# Patient Record
Sex: Male | Born: 1970 | Race: Black or African American | Hispanic: No | Marital: Single | Smoking: Former smoker
Health system: Southern US, Community
[De-identification: ages and names within clinical notes are randomized; demographics above are authoritative.]

## PROBLEM LIST (undated history)

## (undated) DIAGNOSIS — G4730 Sleep apnea, unspecified: Secondary | ICD-10-CM

## (undated) DIAGNOSIS — G8929 Other chronic pain: Secondary | ICD-10-CM

## (undated) DIAGNOSIS — G43909 Migraine, unspecified, not intractable, without status migrainosus: Secondary | ICD-10-CM

## (undated) DIAGNOSIS — I498 Other specified cardiac arrhythmias: Secondary | ICD-10-CM

## (undated) DIAGNOSIS — I10 Essential (primary) hypertension: Secondary | ICD-10-CM

## (undated) DIAGNOSIS — M542 Cervicalgia: Secondary | ICD-10-CM

## (undated) DIAGNOSIS — J45909 Unspecified asthma, uncomplicated: Secondary | ICD-10-CM

## (undated) HISTORY — PX: TOOTH EXTRACTION: SUR596

---

## 2011-06-07 ENCOUNTER — Emergency Department: Payer: Self-pay | Admitting: Emergency Medicine

## 2014-05-29 ENCOUNTER — Emergency Department (HOSPITAL_COMMUNITY)
Admission: EM | Admit: 2014-05-29 | Discharge: 2014-05-29 | Disposition: A | Discharge status: Home / Self Care | Payer: 59 | Attending: Emergency Medicine | Admitting: Emergency Medicine

## 2014-05-29 ENCOUNTER — Encounter (HOSPITAL_COMMUNITY): Payer: Self-pay | Admitting: Emergency Medicine

## 2014-05-29 DIAGNOSIS — R42 Dizziness and giddiness: Secondary | ICD-10-CM | POA: Diagnosis not present

## 2014-05-29 DIAGNOSIS — R110 Nausea: Secondary | ICD-10-CM | POA: Diagnosis not present

## 2014-05-29 DIAGNOSIS — R6883 Chills (without fever): Secondary | ICD-10-CM | POA: Diagnosis not present

## 2014-05-29 DIAGNOSIS — H53149 Visual discomfort, unspecified: Secondary | ICD-10-CM | POA: Insufficient documentation

## 2014-05-29 DIAGNOSIS — G43909 Migraine, unspecified, not intractable, without status migrainosus: Secondary | ICD-10-CM | POA: Insufficient documentation

## 2014-05-29 HISTORY — DX: Cervicalgia: M54.2

## 2014-05-29 HISTORY — DX: Migraine, unspecified, not intractable, without status migrainosus: G43.909

## 2014-05-29 HISTORY — DX: Other specified cardiac arrhythmias: I49.8

## 2014-05-29 HISTORY — DX: Essential (primary) hypertension: I10

## 2014-05-29 HISTORY — DX: Other chronic pain: G89.29

## 2014-05-29 MED ORDER — DIPHENHYDRAMINE HCL 50 MG/ML IJ SOLN
25.0000 mg | INTRAMUSCULAR | Status: AC
  Administered 2014-05-29: 25 mg via INTRAVENOUS
  Filled 2014-05-29: qty 1

## 2014-05-29 MED ORDER — SODIUM CHLORIDE 0.9 % IV BOLUS (SEPSIS)
1000.0000 mL | Freq: Once | INTRAVENOUS | Status: AC
  Administered 2014-05-29: 1000 mL via INTRAVENOUS

## 2014-05-29 MED ORDER — KETOROLAC TROMETHAMINE 30 MG/ML IJ SOLN
30.0000 mg | INTRAMUSCULAR | Status: AC
  Administered 2014-05-29: 30 mg via INTRAVENOUS
  Filled 2014-05-29: qty 1

## 2014-05-29 MED ORDER — MORPHINE SULFATE 4 MG/ML IJ SOLN
4.0000 mg | Freq: Once | INTRAMUSCULAR | Status: AC
Start: 2014-05-29 — End: 2014-05-29
  Administered 2014-05-29: 4 mg via INTRAVENOUS
  Filled 2014-05-29: qty 1

## 2014-05-29 MED ORDER — PROCHLORPERAZINE EDISYLATE 5 MG/ML IJ SOLN
10.0000 mg | Freq: Once | INTRAMUSCULAR | Status: AC
  Administered 2014-05-29: 10 mg via INTRAVENOUS
  Filled 2014-05-29: qty 2

## 2014-05-29 NOTE — ED Notes (Signed)
Bed: [id number] Expected date: 05/29/14 Expected time: 3:32 PM Means of arrival: Ambulance Comments: Migraine

## 2014-05-29 NOTE — ED Notes (Signed)
Pt reports migraine L-sided, onset at 3-4am. Took imitrex 9am. Was going to drive to [place] this am but had "some orthostatic changes" per ems. Called ems then. EMS BP 140/80. Sinus arrythmia on ems ekg, this is normal for pt. IV 18g L FA. NS 100ml infused, 4mg  Zofran. Hx chronic neck pain.

## 2014-05-29 NOTE — Discharge Instructions (Signed)
Please follow the directions provided.  Be sure to follow up with your primary care provider regarding the frequency of your migraines and talk to him about preventive therapies.  Don't hesitate to return for new, concerning or worsening symptoms.     SEEK MEDICAL CARE IF:  You do not get relief from the medicines given to you.  You have a recurrence of pain.  You have a fever. SEEK IMMEDIATE MEDICAL CARE IF:  Your migraine becomes severe.  You have a stiff neck.  You have loss of vision.  You have muscular weakness or loss of muscle control.  You start losing your balance or have trouble walking.  You feel faint or pass out.  You have severe symptoms that are different from your first symptoms

## 2014-05-29 NOTE — ED Provider Notes (Signed)
CSN: [id number]     Arrival date & time 05/29/14  1540 History   First MD Initiated Contact with Patient 05/29/14 1543     Chief Complaint  Patient presents with  . Migraine  . Nausea  . Abdominal Pain   (Consider location/radiation/quality/duration/timing/severity/associated sxs/prior Treatment) HPI Taylor Gordon is a 43 yo male presenting with migraine onset 3 am.  He has a history of migraines and his last migraine was 2 days ago.  This headache is similar to his other headaches.  He describes the pain as sharp and constant and rates it as 10/10.  His associated symptoms include nausea, some dizziness, chills photophobia and left eye pain.  He denies any unilateral tearing or loss of vision, fever, neck stiffness or vomiting.   No past medical history on file. No past surgical history on file. No family history on file. History  Substance Use Topics  . Smoking status: Not on file  . Smokeless tobacco: Not on file  . Alcohol Use: Not on file    Review of Systems  Constitutional: Positive for chills. Negative for fever.  HENT: Negative for sore throat.   Eyes: Positive for photophobia. Negative for visual disturbance.  Respiratory: Negative for cough and shortness of breath.   Cardiovascular: Negative for chest pain and leg swelling.  Gastrointestinal: Positive for nausea. Negative for vomiting and diarrhea.  Genitourinary: Negative for dysuria.  Musculoskeletal: Negative for myalgias.  Skin: Negative for rash.  Neurological: Positive for dizziness and headaches. Negative for weakness and numbness.    Allergies  Review of patient's allergies indicates not on file.  Home Medications   Prior to Admission medications   Not on File   BP 157/103  Pulse 95  Temp(Src) 98.6 F (37 C) (Oral)  Resp 20  SpO2 100% Physical Exam  Nursing note and vitals reviewed. Constitutional: He is oriented to person, place, and time. He appears well-developed and well-nourished. No  distress.  HENT:  Head: Normocephalic and atraumatic.    Mouth/Throat: Oropharynx is clear and moist. No oropharyngeal exudate.  Eyes: Conjunctivae are normal. Pupils are equal, round, and reactive to light.  Neck: Normal range of motion. Neck supple. No thyromegaly present.  Cardiovascular: Normal rate, regular rhythm and intact distal pulses.   Pulmonary/Chest: Effort normal and breath sounds normal. No respiratory distress. He has no wheezes. He has no rales. He exhibits no tenderness.  Abdominal: Soft. There is no tenderness.  Musculoskeletal: He exhibits no tenderness.  Lymphadenopathy:    He has no cervical adenopathy.  Neurological: He is alert and oriented to person, place, and time. No cranial nerve deficit or sensory deficit. Coordination normal. GCS eye subscore is 4. GCS verbal subscore is 5. GCS motor subscore is 6.  Skin: Skin is warm and dry. No rash noted. He is not diaphoretic.  Psychiatric: He has a normal mood and affect.    ED Course  Procedures (including critical care time) Labs Review Labs Reviewed - No data to display  Imaging Review No results found.   EKG Interpretation None      MDM   Final diagnoses:  Migraine without status migrainosus, not intractable, unspecified migraine type   43 yo with migraine similar in presentation to previous migraines, not relieved by imitrex.  Presentation is like pts typical HA and non concerning for [facility], ICH, Meningitis, or temporal arteritis.  Neuro exam is normal and no focal neuro deficits, pt is afebrile, no nuchal rigidity or visual changes.  No indication  for imaging or labs.  NS bolus with toradol, compazine and benadryl and re-eval. Pt HA treated and improved while in ED.  Discharge instructions include follow-up with PCP to discuss prophylactic medication. Pt verbalizes understanding and is agreeable with plan to dc. Return precautions provided.  Filed Vitals:   05/29/14 1541 05/29/14 1702 05/29/14 1822  BP:  157/103 143/104 135/91  Pulse: 95 92 70  Temp: 98.6 F (37 C)    TempSrc: Oral    Resp: 20 18 14   SpO2: 100% 99% 100%   Meds given in ED:  Medications  sodium chloride 0.9 % bolus 1,000 mL (0 mLs Intravenous Stopped 05/29/14 1718)  ketorolac (TORADOL) 30 MG/ML injection 30 mg (30 mg Intravenous Given 05/29/14 1620)  prochlorperazine (COMPAZINE) injection 10 mg (10 mg Intravenous Given 05/29/14 1620)  diphenhydrAMINE (BENADRYL) injection 25 mg (25 mg Intravenous Given 05/29/14 1620)  morphine 4 MG/ML injection 4 mg (4 mg Intravenous Given 05/29/14 1717)    Discharge Medication List as of 05/29/2014  6:00 PM         Harle BattiestElizabeth Shaman Muscarella, NP 05/30/14 2351

## 2014-05-31 NOTE — ED Provider Notes (Signed)
Medical screening examination/treatment/procedure(s) were performed by non-physician practitioner and as supervising physician I was immediately available for consultation/collaboration.    Vida RollerBrian D Calee Nugent, MD 05/31/14 [phone]

## 2020-03-30 ENCOUNTER — Emergency Department
Admission: EM | Admit: 2020-03-30 | Discharge: 2020-03-30 | Disposition: A | Discharge status: Home / Self Care | Payer: 59 | Attending: Student in an Organized Health Care Education/Training Program | Admitting: Student in an Organized Health Care Education/Training Program

## 2020-03-30 ENCOUNTER — Encounter: Payer: Self-pay | Admitting: Emergency Medicine

## 2020-03-30 ENCOUNTER — Emergency Department: Payer: 59

## 2020-03-30 ENCOUNTER — Other Ambulatory Visit: Payer: Self-pay

## 2020-03-30 DIAGNOSIS — R410 Disorientation, unspecified: Secondary | ICD-10-CM | POA: Insufficient documentation

## 2020-03-30 DIAGNOSIS — Z87891 Personal history of nicotine dependence: Secondary | ICD-10-CM | POA: Diagnosis not present

## 2020-03-30 DIAGNOSIS — I10 Essential (primary) hypertension: Secondary | ICD-10-CM | POA: Insufficient documentation

## 2020-03-30 DIAGNOSIS — Z79899 Other long term (current) drug therapy: Secondary | ICD-10-CM | POA: Insufficient documentation

## 2020-03-30 DIAGNOSIS — R5383 Other fatigue: Secondary | ICD-10-CM | POA: Insufficient documentation

## 2020-03-30 LAB — BASIC METABOLIC PANEL
Anion gap: 12 (ref 5–15)
BUN: 7 mg/dL (ref 6–20)
CO2: 26 mmol/L (ref 22–32)
Calcium: 9.3 mg/dL (ref 8.9–10.3)
Chloride: 102 mmol/L (ref 98–111)
Creatinine, Ser: 0.71 mg/dL (ref 0.61–1.24)
GFR calc Af Amer: >60 mL/min (ref >60)
GFR calc non Af Amer: >60 mL/min (ref >60)
Glucose, Bld: 82 mg/dL (ref 70–99)
Potassium: 4.3 mmol/L (ref 3.5–5.1)
Sodium: 140 mmol/L (ref 135–145)

## 2020-03-30 LAB — URINALYSIS, COMPLETE (UACMP) WITH MICROSCOPIC
Bacteria, UA: NONE SEEN
Bilirubin Urine: NEGATIVE
Glucose, UA: NEGATIVE mg/dL
Hgb urine dipstick: NEGATIVE
Ketones, ur: NEGATIVE mg/dL
Leukocytes,Ua: NEGATIVE
Nitrite: NEGATIVE
Protein, ur: NEGATIVE mg/dL
Specific Gravity, Urine: 1.013 (ref 1.005–1.030)
Squamous Epithelial / LPF: NONE SEEN (ref 0–5)
pH: 7 (ref 5.0–8.0)

## 2020-03-30 LAB — CBC
HCT: 45.2 % (ref 39.0–52.0)
Hemoglobin: 15.9 g/dL (ref 13.0–17.0)
MCH: 30.9 pg (ref 26.0–34.0)
MCHC: 35.2 g/dL (ref 30.0–36.0)
MCV: 87.8 fL (ref 80.0–100.0)
Platelets: 280 10*3/uL (ref 150–400)
RBC: 5.15 MIL/uL (ref 4.22–5.81)
RDW: 12.7 % (ref 11.5–15.5)
WBC: 4.9 10*3/uL (ref 4.0–10.5)
nRBC: 0 % (ref 0.0–0.2)

## 2020-03-30 LAB — GLUCOSE, CAPILLARY: Glucose-Capillary: 110 mg/dL — ABNORMAL HIGH (ref 70–99)

## 2020-03-30 NOTE — ED Triage Notes (Signed)
Patient reports he received his second COVID vaccine in April. Since then, he has had episodes of confusion or "brain fog". States he is unable to remember how to do basic tasks that he does daily. Patient reports this morning he was unable to set his work station up. Reports last episode was Monday or Tuesday. States episodes last from a few seconds to several minutes. Denies weakness. States he has had trouble staying caught up at work due to trouble initiating tasks and problem solving.

## 2020-03-30 NOTE — ED Triage Notes (Signed)
First Nurse Note:  Arrives stating that since April he has felt intermittently spacey and confused.  Patient states he has felt this way since receiving COVID shot.    Patient is AAOx3. Skin warm and dry.  MAE equally and strong.  NAD

## 2020-03-30 NOTE — ED Notes (Signed)
This RN attempted IV stick x 2 with no success.

## 2020-03-30 NOTE — ED Provider Notes (Signed)
[facility] Emergency Department Provider Note    First MD Initiated Contact with Patient 03/30/20 1300     (approximate)  I have reviewed the triage vital signs and the nursing notes.   HISTORY  Chief Complaint Altered Mental Status    HPI Taylor Gordon is a 49 y.o. male with the below listed past medical history presents to the ER for evaluation of intermittent confusion some fatigue and feeling like he is functioning less efficiently at work since April.  Feels like the symptoms started around the time that he got his Covid vaccination.  Denies any fevers.  Does have chronic migraines.  No new medications.  Does not drink alcohol.  Has not suddenly stopped any medications.  Does not feel like he is under any increased stress at home or at work.    Past Medical History:  Diagnosis Date  . Chronic neck pain   . Hypertension   . Migraine   . Sinus arrhythmia seen on electrocardiogram    No family history on file. History reviewed. No pertinent surgical history. There are no problems to display for this patient.     Prior to Admission medications   Medication Sig Start Date End Date Taking? Authorizing Provider  acetaminophen (TYLENOL) 500 MG tablet Take 1,000 mg by mouth every 6 (six) hours as needed for headache.    [provider]  propranolol (INDERAL) 10 MG tablet Take 10 mg by mouth 2 (two) times daily.    [provider]  SUMAtriptan (IMITREX) 25 MG tablet Take 25 mg by mouth every 2 (two) hours as needed for migraine. May repeat in 2 hours if headache persists or recurs.    [provider]    Allergies Prednisone    Social History Social History   Tobacco Use  . Smoking status: Former Smoker  Substance Use Topics  . Alcohol use: Yes    Comment: socially  . Drug use: No    Review of Systems Patient denies headaches, rhinorrhea, blurry vision, numbness, shortness of breath, chest pain, edema,  cough, abdominal pain, nausea, vomiting, diarrhea, dysuria, fevers, rashes or hallucinations unless otherwise stated above in HPI. ____________________________________________   PHYSICAL EXAM:  VITAL SIGNS: Vitals:   03/30/20 0939 03/30/20 1426  BP: (!) 178/127 (!) 148/107  Pulse: (!) 122 88  Resp: 16 16  Temp: 99 F (37.2 C)   SpO2: 98% 98%    Constitutional: Alert and oriented.  Eyes: Conjunctivae are normal.  Head: Atraumatic. Nose: No congestion/rhinnorhea. Mouth/Throat: Mucous membranes are moist.   Neck: No stridor. Painless ROM.  Cardiovascular: Normal rate, regular rhythm. Grossly normal heart sounds.  Good peripheral circulation. Respiratory: Normal respiratory effort.  No retractions. Lungs CTAB. Gastrointestinal: Soft and nontender. No distention. No abdominal bruits. No CVA tenderness. Genitourinary:  Musculoskeletal: No lower extremity tenderness nor edema.  No joint effusions. Neurologic: CN- intact.  No facial droop, Normal FNF.  Normal heel to shin.  Sensation intact bilaterally. Normal speech and language. No gross focal neurologic deficits are appreciated. No gait instability. Skin:  Skin is warm, dry and intact. No rash noted. Psychiatric: Mood and affect are normal. Speech and behavior are normal.  ____________________________________________   LABS (all labs ordered are listed, but only abnormal results are displayed)  Results for orders placed or performed during the hospital encounter of 03/30/20 (from the past 24 hour(s))  Glucose, capillary     Status: Abnormal   Collection Time: 03/30/20  9:54 AM  Result Value Ref Range   Glucose-Capillary 110 (H) 70 - 99 mg/dL  CBC     Status: None   Collection Time: 03/30/20  9:55 AM  Result Value Ref Range   WBC 4.9 4.0 - 10.5 K/uL   RBC 5.15 4.22 - 5.81 MIL/uL   Hemoglobin 15.9 13.0 - 17.0 g/dL   HCT 08.6 39 - 52 %   MCV 87.8 80.0 - 100.0 fL   MCH 30.9 26.0 - 34.0 pg   MCHC 35.2 30.0 - 36.0 g/dL   RDW  76.1 95.0 - 93.2 %   Platelets 280 150 - 400 K/uL   nRBC 0.0 0.0 - 0.2 %  Urinalysis, Complete w Microscopic     Status: Abnormal   Collection Time: 03/30/20  1:58 PM  Result Value Ref Range   Color, Urine YELLOW (A) YELLOW   APPearance CLEAR (A) CLEAR   Specific Gravity, Urine 1.013 1.005 - 1.030   pH 7.0 5.0 - 8.0   Glucose, UA NEGATIVE NEGATIVE mg/dL   Hgb urine dipstick NEGATIVE NEGATIVE   Bilirubin Urine NEGATIVE NEGATIVE   Ketones, ur NEGATIVE NEGATIVE mg/dL   Protein, ur NEGATIVE NEGATIVE mg/dL   Nitrite NEGATIVE NEGATIVE   Leukocytes,Ua NEGATIVE NEGATIVE   WBC, UA 0-5 0 - 5 WBC/hpf   Bacteria, UA NONE SEEN NONE SEEN   Squamous Epithelial / LPF NONE SEEN 0 - 5   Mucus PRESENT   Basic metabolic panel     Status: None   Collection Time: 03/30/20  1:58 PM  Result Value Ref Range   Sodium 140 135 - 145 mmol/L   Potassium 4.3 3.5 - 5.1 mmol/L   Chloride 102 98 - 111 mmol/L   CO2 26 22 - 32 mmol/L   Glucose, Bld 82 70 - 99 mg/dL   BUN 7 6 - 20 mg/dL   Creatinine, Ser 6.71 0.61 - 1.24 mg/dL   Calcium 9.3 8.9 - 24.5 mg/dL   GFR calc non Af Amer >60 >60 mL/min   GFR calc Af Amer >60 >60 mL/min   Anion gap 12 5 - 15   ____________________________________________  EKG My review and personal interpretation at Time: 9:40   Indication: confusion  Rate: 105  Rhythm: sinus Axis: normal Other: nonspecific t wave abn, no stemi ____________________________________________  RADIOLOGY  I personally reviewed all radiographic images ordered to evaluate for the above acute complaints and reviewed radiology reports and findings.  These findings were personally discussed with the patient.  Please see medical record for radiology report.  ____________________________________________   PROCEDURES  Procedure(s) performed:  Procedures    Critical Care performed: no ____________________________________________   INITIAL IMPRESSION / ASSESSMENT AND PLAN / ED COURSE  Pertinent  labs & imaging results that were available during my care of the patient were reviewed by me and considered in my medical decision making (see chart for details).   DDX: Dehydration, electrolyte abnormality, anemia, medication reaction, CVA, mass  Taylor Gordon is a 49 y.o. who presents to the ED with symptoms as described above.  Patient well-appearing clinically no focal neuro deficits.  Symptoms have been ongoing since April.  Afebrile hemodynamically stable.  No signs of infectious process.  Blood work is reassuring.  No clear explanation for his symptoms but do feel that he benefit from referral to neurology.     The patient was evaluated in Emergency Department today for the symptoms described in the history of present illness. He/she was evaluated in the context of the  global COVID-19 pandemic, which necessitated consideration that the patient might be at risk for infection with the SARS-CoV-2 virus that causes COVID-19. Institutional protocols and algorithms that pertain to the evaluation of patients at risk for COVID-19 are in a state of rapid change based on information released by regulatory bodies including the CDC and federal and state organizations. These policies and algorithms were followed during the patient's care in the ED.  As part of my medical decision making, I reviewed the following data within the electronic MEDICAL RECORD NUMBER Nursing notes reviewed and incorporated, Labs reviewed, notes from prior ED visits and [place] Controlled Substance Database   ____________________________________________   FINAL CLINICAL IMPRESSION(S) / ED DIAGNOSES  Final diagnoses:  Confusion      NEW MEDICATIONS STARTED DURING THIS VISIT:  New Prescriptions   No medications on file     Note:  This document was prepared using Dragon voice recognition software and may include unintentional dictation errors.    Willy Eddy, MD 03/30/20 1459

## 2021-08-28 IMAGING — CT CT HEAD W/O CM
3 series · 16 of 47 positions shown, 19 images · non-contrast
Comparison: None.

CLINICAL DATA: Intermittent confusion and mental status change.
Patient reports symptoms since COVID vaccination.

EXAM:
CT HEAD WITHOUT CONTRAST
TECHNIQUE: Contiguous axial images were obtained from the base of the skull
through the vertex without intravenous contrast.

[Series 2: head wo · axial · 0.44mm/px · z∈[+518,+648]mm · 10 of 32 slices shown, 13 images]
[im 3/32  brain]
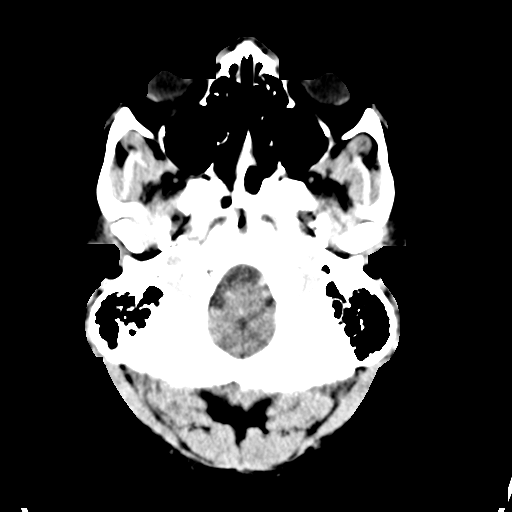
[im 3/32  bone]
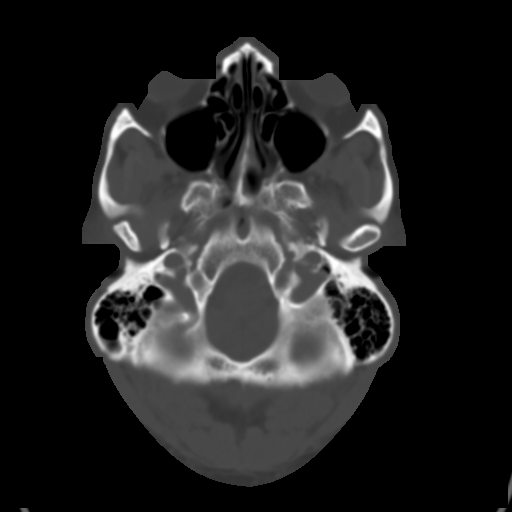
[im 6/32  brain]
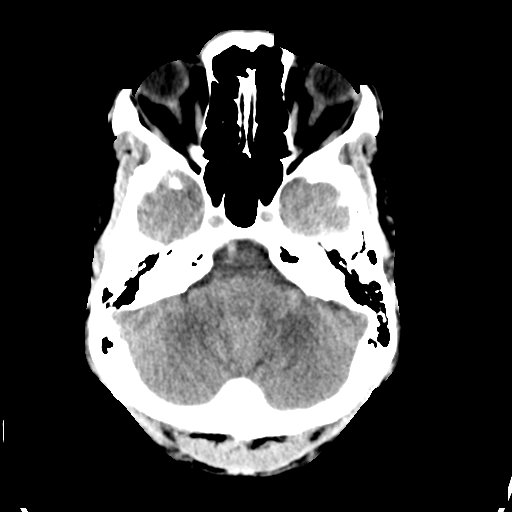
[im 9/32  brain]
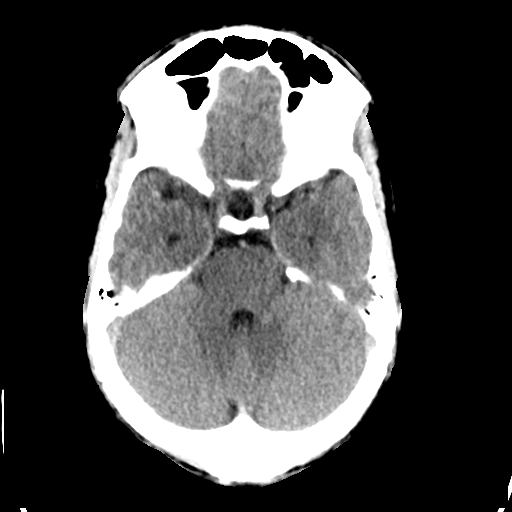
[im 11/32  brain]
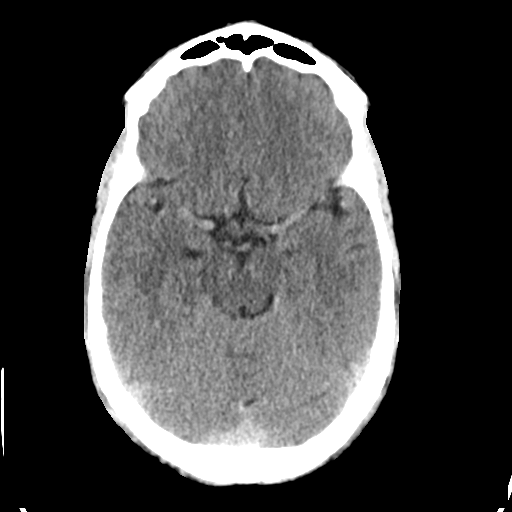
[im 14/32  brain]
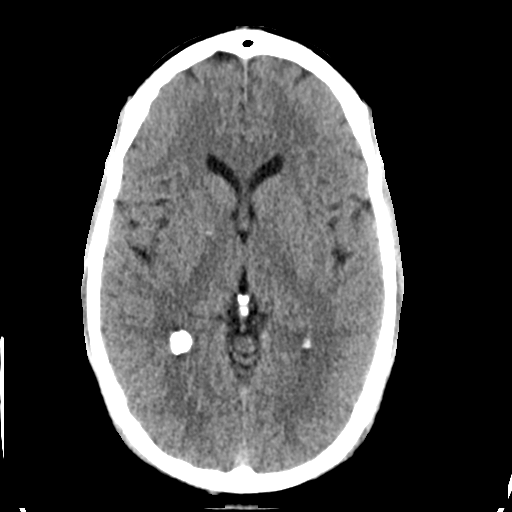
[im 14/32  bone]
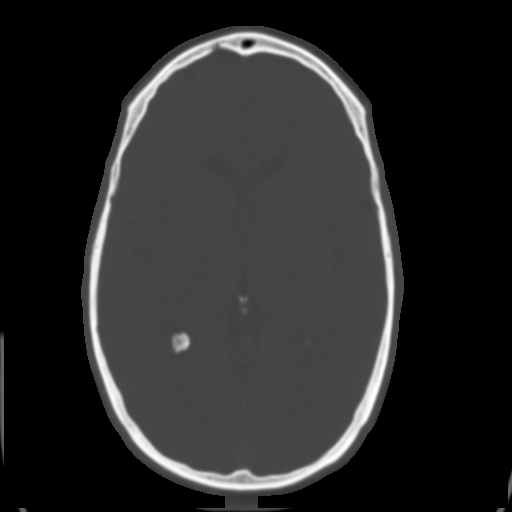
[im 18/32  brain]
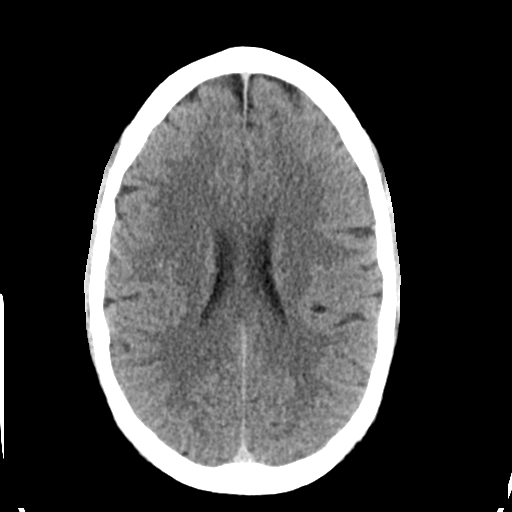
[im 21/32  brain]
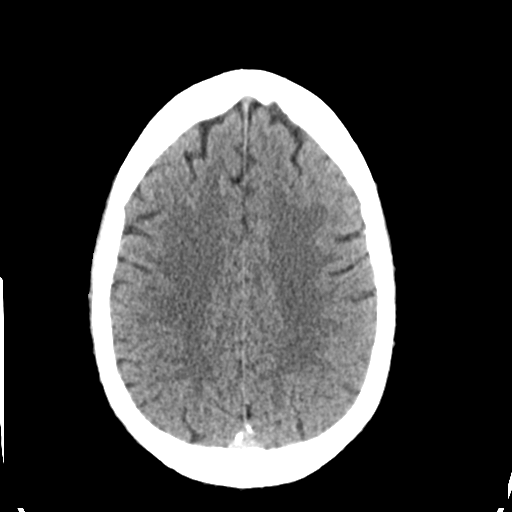
[im 24/32  brain]
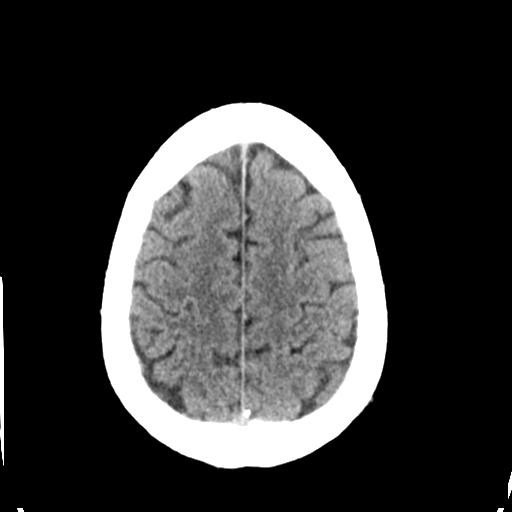
[im 26/32  brain]
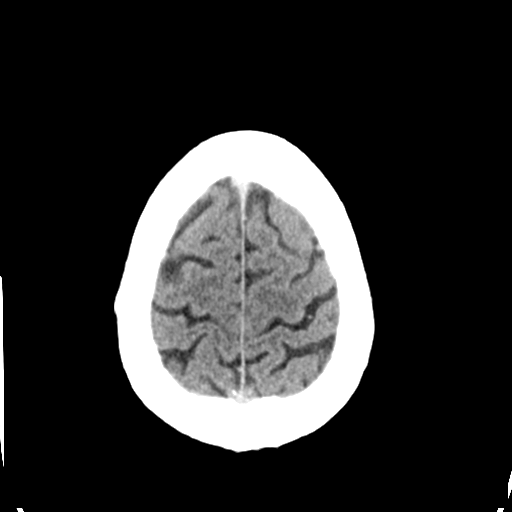
[im 26/32  bone]
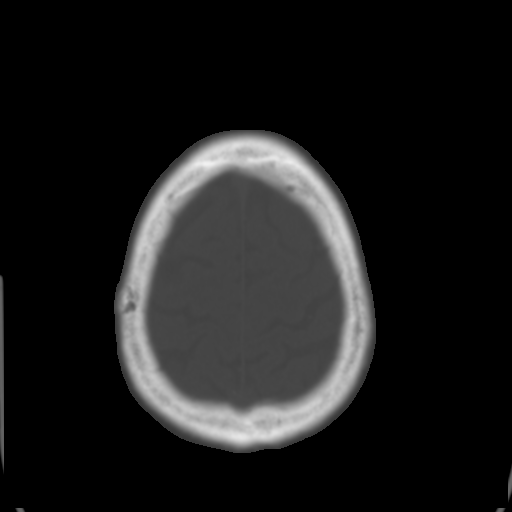
[im 29/32  brain]
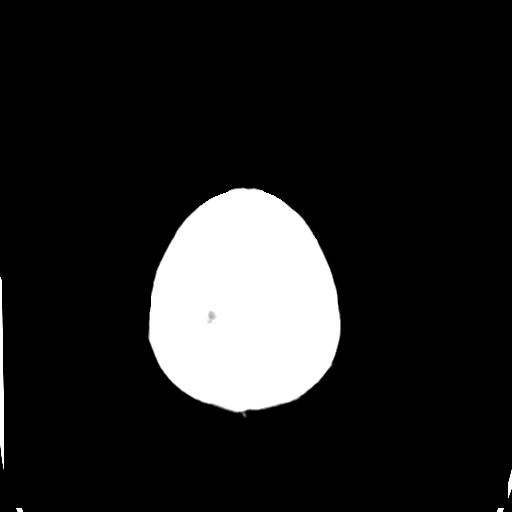

[Series 4: coronal soft tissue · coronal · 0.32mm/px · 3 of 75 slices shown]
[im 25/75  brain]
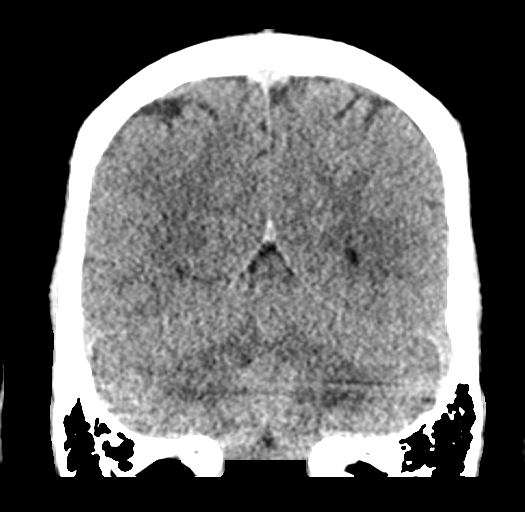
[im 33/75  brain]
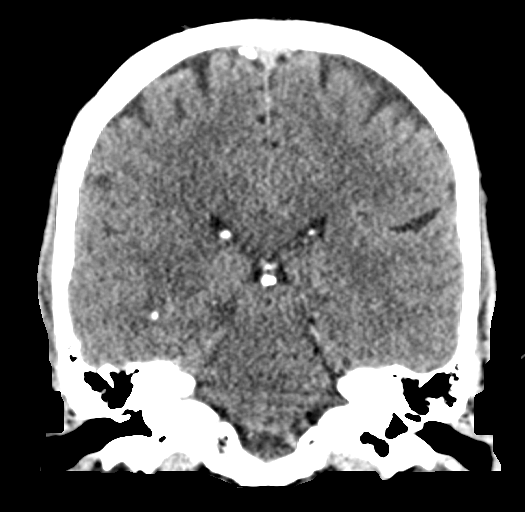
[im 42/75  brain]
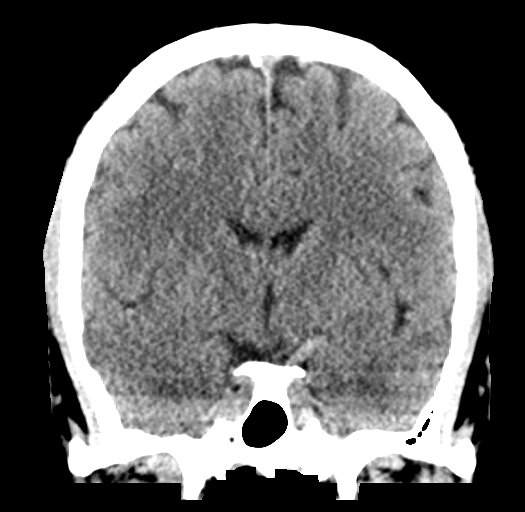

[Series 5: sagittal soft tissue · sagittal · 0.32mm/px · 3 of 54 slices shown]
[im 18/54  brain]
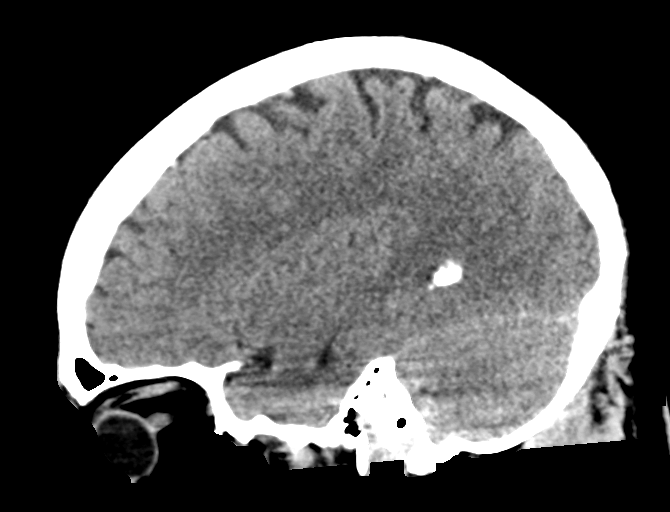
[im 27/54  brain]
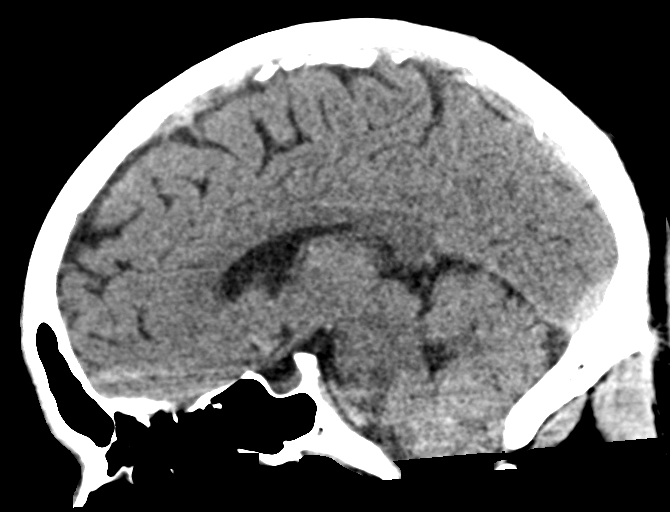
[im 36/54  brain]
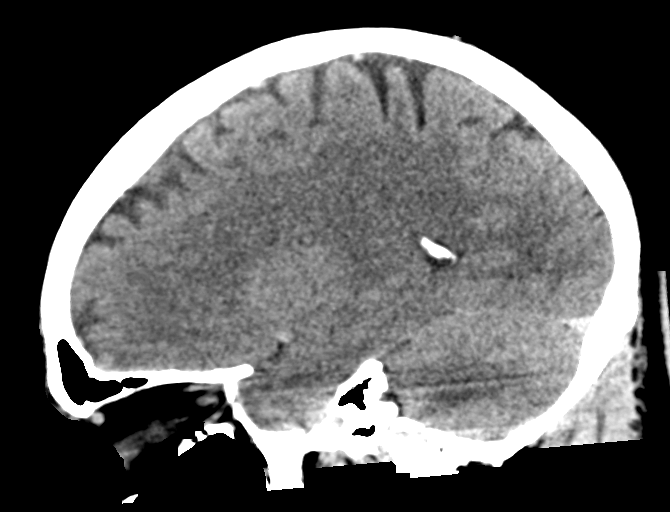

[16 of 47 positions shown; findings below may reference images not displayed]

FINDINGS: Brain: No evidence of acute infarction, hemorrhage, hydrocephalus,
extra-axial collection or mass lesion/mass effect.

Vascular: No hyperdense vessel or unexpected calcification.

Skull: Normal. Negative for fracture or focal lesion.

Sinuses/Orbits: No acute finding.

Other: None.
IMPRESSION: No acute intracranial abnormalities. Normal brain.

## 2022-05-01 ENCOUNTER — Emergency Department: Payer: Commercial Managed Care - PPO

## 2022-05-01 ENCOUNTER — Emergency Department
Admission: EM | Admit: 2022-05-01 | Discharge: 2022-05-01 | Disposition: A | Discharge status: Home / Self Care | Payer: Commercial Managed Care - PPO | Attending: Emergency Medicine | Admitting: Emergency Medicine

## 2022-05-01 ENCOUNTER — Encounter: Payer: Self-pay | Admitting: Emergency Medicine

## 2022-05-01 ENCOUNTER — Other Ambulatory Visit: Payer: Self-pay

## 2022-05-01 DIAGNOSIS — R109 Unspecified abdominal pain: Secondary | ICD-10-CM

## 2022-05-01 DIAGNOSIS — R1032 Left lower quadrant pain: Secondary | ICD-10-CM | POA: Diagnosis present

## 2022-05-01 DIAGNOSIS — R319 Hematuria, unspecified: Secondary | ICD-10-CM | POA: Insufficient documentation

## 2022-05-01 LAB — CBC WITH DIFFERENTIAL/PLATELET
Abs Immature Granulocytes: 0.02 10*3/uL (ref 0.00–0.07)
Basophils Absolute: 0.1 10*3/uL (ref 0.0–0.1)
Basophils Relative: 1 %
Eosinophils Absolute: 0.2 10*3/uL (ref 0.0–0.5)
Eosinophils Relative: 3 %
HCT: 47.6 % (ref 39.0–52.0)
Hemoglobin: 16.1 g/dL (ref 13.0–17.0)
Immature Granulocytes: 0 %
Lymphocytes Relative: 31 %
Lymphs Abs: 2 10*3/uL (ref 0.7–4.0)
MCH: 30.3 pg (ref 26.0–34.0)
MCHC: 33.8 g/dL (ref 30.0–36.0)
MCV: 89.6 fL (ref 80.0–100.0)
Monocytes Absolute: 0.6 10*3/uL (ref 0.1–1.0)
Monocytes Relative: 9 %
Neutro Abs: 3.7 10*3/uL (ref 1.7–7.7)
Neutrophils Relative %: 56 %
Platelets: 292 10*3/uL (ref 150–400)
RBC: 5.31 MIL/uL (ref 4.22–5.81)
RDW: 13.1 % (ref 11.5–15.5)
WBC: 6.6 10*3/uL (ref 4.0–10.5)
nRBC: 0 % (ref 0.0–0.2)

## 2022-05-01 LAB — COMPREHENSIVE METABOLIC PANEL
ALT: 39 U/L (ref 0–44)
AST: 32 U/L (ref 15–41)
Albumin: 4.3 g/dL (ref 3.5–5.0)
Alkaline Phosphatase: 80 U/L (ref 38–126)
Anion gap: 8 (ref 5–15)
BUN: 7 mg/dL (ref 6–20)
CO2: 20 mmol/L — ABNORMAL LOW (ref 22–32)
Calcium: 9 mg/dL (ref 8.9–10.3)
Chloride: 110 mmol/L (ref 98–111)
Creatinine, Ser: 1.1 mg/dL (ref 0.61–1.24)
GFR, Estimated: >60 mL/min (ref >60)
Glucose, Bld: 103 mg/dL — ABNORMAL HIGH (ref 70–99)
Potassium: 4.1 mmol/L (ref 3.5–5.1)
Sodium: 138 mmol/L (ref 135–145)
Total Bilirubin: 1.2 mg/dL (ref 0.3–1.2)
Total Protein: 7.3 g/dL (ref 6.5–8.1)

## 2022-05-01 LAB — URINALYSIS, ROUTINE W REFLEX MICROSCOPIC
Bacteria, UA: NONE SEEN
Bilirubin Urine: NEGATIVE
Glucose, UA: NEGATIVE mg/dL
Ketones, ur: NEGATIVE mg/dL
Nitrite: NEGATIVE
Protein, ur: NEGATIVE mg/dL
Specific Gravity, Urine: 1.008 (ref 1.005–1.030)
pH: 8 (ref 5.0–8.0)

## 2022-05-01 MED ORDER — HYDROCODONE-ACETAMINOPHEN 5-325 MG PO TABS: 1.0000 | ORAL_TABLET | ORAL | 0 refills | Status: DC | PRN

## 2022-05-01 MED ORDER — HYDROMORPHONE HCL 1 MG/ML IJ SOLN
1.0000 mg | Freq: Once | INTRAMUSCULAR | Status: AC
  Administered 2022-05-01: 1 mg via INTRAVENOUS
  Filled 2022-05-01: qty 1

## 2022-05-01 MED ORDER — TAMSULOSIN HCL 0.4 MG PO CAPS: 0.4000 mg | ORAL_CAPSULE | Freq: Every day | ORAL | 0 refills | Status: AC

## 2022-05-01 MED ORDER — SODIUM CHLORIDE 0.9 % IV BOLUS
1000.0000 mL | Freq: Once | INTRAVENOUS | Status: AC
  Administered 2022-05-01: 1000 mL via INTRAVENOUS

## 2022-05-01 MED ORDER — ONDANSETRON HCL 4 MG/2ML IJ SOLN
4.0000 mg | Freq: Four times a day (QID) | INTRAMUSCULAR | Status: DC | PRN
  Administered 2022-05-01: 4 mg via INTRAVENOUS
  Filled 2022-05-01: qty 2

## 2022-05-01 NOTE — ED Triage Notes (Signed)
Patient ambulatory to triage with steady gait, without difficulty or distress noted; pt reports left flank pain radiating into side/abd since 4am; st noted some diff urinating as well; denies hx of same

## 2022-05-01 NOTE — ED Provider Notes (Signed)
[facility] Provider Note   Event Date/Time   First MD Initiated Contact with Patient 05/01/22 [phone]     (approximate) History  Flank Pain  HPI Taylor Gordon is a 51 y.o. male with a stated past medical history of hypertension and chronic migraines who presents for left flank pain that began at approximately 4 AM today, 10/10 in severity, and radiating into the left side, left lower quadrant and into the left thigh.  Patient also endorses some difficulty with urination. ROS: Patient currently denies any vision changes, tinnitus, difficulty speaking, facial droop, sore throat, chest pain, shortness of breath, nausea/vomiting/diarrhea, or weakness/numbness/paresthesias in any extremity   Physical Exam  Triage Vital Signs: ED Triage Vitals  Enc Vitals Group     BP 05/01/22 0705 (!) 153/98     Pulse Rate 05/01/22 0705 (!) 106     Resp 05/01/22 0705 18     Temp 05/01/22 0705 98.1 F (36.7 C)     Temp Source 05/01/22 0705 Oral     SpO2 05/01/22 0705 98 %     Weight 05/01/22 0702 210 lb (95.3 kg)     Height 05/01/22 0702 6\' 4"  (1.93 m)     Head Circumference --      Peak Flow --      Pain Score 05/01/22 0702 10     Pain Loc --      Pain Edu? --      Excl. in GC? --    Most recent vital signs: Vitals:   05/01/22 0830 05/01/22 1037  BP: (!) 151/102 (!) 146/107  Pulse: 86 (!) 104  Resp:  18  Temp:    SpO2: 94% 99%   General: Awake, oriented x4. CV:  Good peripheral perfusion.  Resp:  Normal effort.  Abd:  No distention.  Other:  Middle-aged African-American male laying in bed in moderate distress secondary to pain ED Results / Procedures / Treatments  Labs (all labs ordered are listed, but only abnormal results are displayed) Labs Reviewed  COMPREHENSIVE METABOLIC PANEL - Abnormal; Notable for the following components:      Result Value   CO2 20 (*)    Glucose, Bld 103 (*)    All other components within normal limits  URINALYSIS, ROUTINE W  REFLEX MICROSCOPIC - Abnormal; Notable for the following components:   Color, Urine YELLOW (*)    APPearance HAZY (*)    Hgb urine dipstick MODERATE (*)    Leukocytes,Ua TRACE (*)    All other components within normal limits  CBC WITH DIFFERENTIAL/PLATELET    RADIOLOGY ED MD interpretation: CT renal stone study interpreted by me and shows nonobstructing left nephrolithiasis, no hydronephrosis, and an incidentally found left adrenal nodule -Agree with radiology assessment Official radiology report(s): CT Renal Stone Study  Result Date: 05/01/2022 CLINICAL DATA:  Left flank pain associated with difficulty urinating EXAM: CT ABDOMEN AND PELVIS WITHOUT CONTRAST TECHNIQUE: Multidetector CT imaging of the abdomen and pelvis was performed following the standard protocol without IV contrast. RADIATION DOSE REDUCTION: This exam was performed according to the departmental dose-optimization program which includes automated exposure control, adjustment of the mA and/or kV according to patient size and/or use of iterative reconstruction technique. COMPARISON:  None Available. FINDINGS: Lower chest: No focal consolidation or pulmonary nodule in the lung bases. No pleural effusion or pneumothorax demonstrated. Partially imaged heart size is normal. Hepatobiliary: No focal hepatic lesions. No intra or extrahepatic biliary ductal dilation. Normal gallbladder. Pancreas: No focal  lesions or main ductal dilation. Spleen: Normal in size without focal abnormality. Adrenals/Urinary Tract: 16 mm left adrenal nodule measures -9 HU, likely adenoma. No follow-up imaging recommended. No right adrenal nodule. Nonobstructing left renal stones measuring up to 6 mm in the interpolar region. No hydronephrosis. No focal bladder wall thickening. Stomach/Bowel: Normal appearance of the stomach. No evidence of bowel wall thickening, distention, or inflammatory changes. Scattered hyperattenuation within the proximal small bowel, likely  related to ingested material. Normal appendix. Vascular/Lymphatic: Aortic atherosclerosis without aneurysmal dilation. Smooth contour of the IVC. No enlarged abdominal or pelvic lymph nodes. Reproductive: Prostate is unremarkable. Other: No free fluid, fluid collection, or free air. Musculoskeletal: No acute or abnormal lytic or blastic osseous lesions. Small fat-containing paraumbilical hernia. IMPRESSION: 1. Nonobstructing left nephrolithiasis.  No hydronephrosis. 2. Left adrenal nodule. Findings are in keeping with a benign adrenal adenoma. If there are clinical signs or symptoms of adrenal hyperfunction, biochemical evaluation may be appropriate. 3. Aortic Atherosclerosis (ICD10-I70.0). Electronically Signed   By: Agustin Cree M.D.   On: 05/01/2022 09:10   PROCEDURES: Critical Care performed: No .1-3 Lead EKG Interpretation  Performed by: Merwyn Katos, MD Authorized by: Merwyn Katos, MD     Interpretation: normal     ECG rate:  97   ECG rate assessment: normal     Rhythm: sinus rhythm     Ectopy: none     Conduction: normal    MEDICATIONS ORDERED IN ED: Medications  ondansetron (ZOFRAN) injection 4 mg (4 mg Intravenous Given 05/01/22 0731)  HYDROmorphone (DILAUDID) injection 1 mg (1 mg Intravenous Given 05/01/22 0732)  sodium chloride 0.9 % bolus 1,000 mL (0 mLs Intravenous Stopped 05/01/22 0902)  HYDROmorphone (DILAUDID) injection 1 mg (1 mg Intravenous Given 05/01/22 0812)   IMPRESSION / MDM / ASSESSMENT AND PLAN / ED COURSE  I reviewed the triage vital signs and the nursing notes.                             The patient is on the cardiac monitor to evaluate for evidence of arrhythmia and/or significant heart rate changes. Patient's presentation is most consistent with acute presentation with potential threat to life or bodily function. Patient presents for severe flank pain. Presentation most consistent with Renal Colic from a Non-infected Kidney Stone. Given History and Exam I  have lower suspicion for atypical appendicitis, genital torsion, acute cholecystitis, AAA, Aortic Dissection, Serious Bacterial Illness or other emergent intraabdominal pathology.  Workup: CBC, BMP, CT Abd/Pelvis noncontrast, UA, reassess Findings: CT kidney stone study does not show any evidence of acute abnormalities UA does show signs of hematuria and flank pain had resolved prior to CT of the abdomen and pelvis concerning for possible passage of a kidney stone.  Patient's pain has significantly improved Reassesment: Patient tolerating PO and pain controlled Disposition:  Discharge. Strict return precautions for infected stone or PO intolerance discussed.   FINAL CLINICAL IMPRESSION(S) / ED DIAGNOSES   Final diagnoses:  Left flank pain  Hematuria, unspecified type   Rx / DC Orders   ED Discharge Orders          Ordered    HYDROcodone-acetaminophen (NORCO) 5-325 MG tablet  Every 4 hours PRN        05/01/22 1130    tamsulosin (FLOMAX) 0.4 MG CAPS capsule  Daily        05/01/22 1130  Note:  This document was prepared using Dragon voice recognition software and may include unintentional dictation errors.   Merwyn Katos, MD 05/01/22 [phone]

## 2022-08-22 ENCOUNTER — Other Ambulatory Visit: Payer: Self-pay | Admitting: Orthopedic Surgery

## 2022-08-22 DIAGNOSIS — M5412 Radiculopathy, cervical region: Secondary | ICD-10-CM

## 2023-09-29 ENCOUNTER — Ambulatory Visit: Payer: Commercial Managed Care - PPO | Admitting: Orthopedic Surgery

## 2023-09-30 ENCOUNTER — Ambulatory Visit: Payer: Commercial Managed Care - PPO | Admitting: Orthopedic Surgery

## 2023-10-27 ENCOUNTER — Ambulatory Visit (INDEPENDENT_AMBULATORY_CARE_PROVIDER_SITE_OTHER): Payer: Commercial Managed Care - PPO | Admitting: Orthopedic Surgery

## 2023-10-27 ENCOUNTER — Other Ambulatory Visit (INDEPENDENT_AMBULATORY_CARE_PROVIDER_SITE_OTHER): Payer: Self-pay

## 2023-10-27 VITALS — BP 119/80 | HR 96 | Ht 76.0 in | Wt 199.0 lb

## 2023-10-27 DIAGNOSIS — M545 Low back pain, unspecified: Secondary | ICD-10-CM | POA: Diagnosis not present

## 2023-10-27 DIAGNOSIS — M542 Cervicalgia: Secondary | ICD-10-CM

## 2023-10-27 NOTE — Progress Notes (Addendum)
 Orthopedic Spine Surgery Office Note  Assessment: Patient is a 53 y.o. male with 2 issues:  Neck pain that radiates into the left upper extremity into the hand from radiculopathy Low back pain that radiates into the left lower extremity, but no stenosis seen on MRI to explain this symptom   Plan: -Patient has tried PT, Tylenol, gabapentin, Flexeril, baclofen, cervical and lumbar steroid injections -Recommended patient bring his MRI so I can review them.  He did this after the visit and the MRI interpretation is included below -He has tried multiple conservative treatments without any relief, so will discuss operative management as an option for his cervical spine. I do not see any significant stenosis for his lumbar spine so would not recommend any kind of surgical intervention -Patient is going to follow-up on 3/13 to discuss treatment options after I've through the MRIs   Patient expressed understanding of the plan and all questions were answered to the patient's satisfaction.   ___________________________________________________________________________   History:  Patient is a 53 y.o. male who presents today for his cervical and lumbar spine.  Patient states that he has had neck pain that radiates into his left upper extremity for about 2 years now.  He said has gotten progressively worse within the last year.  He feels the pain going into the lateral arm, dorsal forearm, and into the hand.  He said he particularly feels it in the middle, ring, and small fingers.  He is not any pain radiating into the right upper extremity.  There is no trauma or injury that preceded the onset of pain.  He says that the pain is worse when he is typing or work at a computer.  He gets numbness and tingling in the same distribution as the pain.  He is also felt that his headaches, which she had before this started, have gotten worse since onset of the symptoms. 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   In regards to his lumbar spine, he has had about a year of back pain that radiates into the left lower extremity.  Is not severe his neck pain that goes into the left upper extremity.  He said he had not instance her to wear the left leg gives out as a result of the pain.  He cannot localize where the pain goes in terms of into the leg (e.g. anterior or lateral) but he says it goes all the way to the level of the ankle.  He said no pain rating into the right lower extremity.  He has been unable to and has been out of work for the last year as a result of these pains.   Weakness: Yes, his left hand feels weaker and at times his left leg.  No other weakness noted Difficulty with fine motor skills (e.g., buttoning shirts, handwriting): Denies Symptoms of imbalance: Denies Paresthesias and numbness: Yes, gets numbness and paresthesias along the lateral arm and dorsal forearm into the ulnar digits.  No other numbness or paresthesias Bowel or bladder incontinence: Denies Saddle anesthesia: Denies  Treatments tried: PT, Tylenol, gabapentin, Flexeril, baclofen, cervical and lumbar steroid injections  Review of systems: Denies fevers and chills, night sweats, unexplained weight loss, history of cancer.  Has had pain that wakes him at night  Past medical history: Chronic pain HTN Migraines  Allergies: NSAIDs, prednisone  Past surgical history:  None  Social history: Denies use  of nicotine product (smoking, vaping, patches, smokeless) Alcohol use: rare Denies recreational drug use   Physical Exam:  BMI of 24.2  General: no acute distress, appears stated age Neurologic: alert, answering questions appropriately, following commands Respiratory: unlabored breathing on room air, symmetric chest rise Psychiatric: appropriate affect, normal cadence to speech   MSK (spine):  -Strength exam      Left  Right Grip  strength                5/5  5/5 Interosseus   5/5   5/5 Wrist extension  5/5  5/5 Wrist flexion   5/5  5/5 Elbow flexion   5/5  5/5 Deltoid    5/5  5/5  EHL    5/5  5/5 TA    5/5  5/5 GSC    5/5  5/5 Knee extension  5/5  5/5 Hip flexion   5/5  5/5  -Sensory exam    Sensation intact to light touch in L2-S1 nerve distributions of bilateral lower extremities  Sensation intact to light touch in C4-T1 nerve distributions of bilateral upper extremities  -Brachioradialis DTR: 2/4 on the left, 2/4 on the right -Biceps DTR: 2/4 on the left, 2/4 on the right -Triceps DTR: 2/4 on the left, 2/4 on the right -Achilles DTR: 2/4 on the left, 2/4 on the right -Patellar tendon DTR: 2/4 on the left, 2/4 on the right  -Spurling: Negative bilaterally -Hoffman sign: Negative bilaterally -Clonus: No beats bilaterally -Interosseous wasting: None seen -Grip and release test: Negative -Romberg: Negative -Gait: Normal  Left shoulder exam: No pain through range of motion, negative Jobe, negative belly press, no weakness with external rotation with arm at side Right shoulder exam: No pain through range of motion  Tinel's at wrist: Negative bilaterally Phalen's at wrist: Negative bilaterally Durkan's: Negative bilaterally  Tinel's at elbow: Negative bilaterally  Imaging: XRs of the cervical spine from 10/27/2023 were independently reviewed and interpreted, showing disc height loss at C4/5, C5/6, and C6/7. Anterior osteophyte formation is seen at those levels as well. No fracture or dislocation seen. No evidence of instability on flexion/extension views.   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 No cord expansion at those levels. Bilateral foraminal stenosis at C4/5. Left sided foraminal stenosis at C5/6. Bilateral foraminal stenosis at C6/7 (L>R). Right sided foraminal stenosis  at C7/T1.  No significant central stenosis seen.   XRs of the lumbar spine from 10/27/2023 were independently reviewed and interpreted, showing no significant degenerative changes. No evidence of instability on flexion/extension views. No fracture or dislocation seen.   MRI of the lumbar spine from 02/12/2023 was independently reviewed and interpreted, showing disc desiccation at L2/3. No other levels with DDD. No significant central, lateral recess, or foraminal stenosis.    Patient name: Taylor Gordon Patient MRN: [id number] Date of visit: 10/27/23

## 2023-10-30 ENCOUNTER — Ambulatory Visit (INDEPENDENT_AMBULATORY_CARE_PROVIDER_SITE_OTHER): Admitting: Orthopedic Surgery

## 2023-10-30 DIAGNOSIS — M5412 Radiculopathy, cervical region: Secondary | ICD-10-CM | POA: Diagnosis not present

## 2023-10-30 NOTE — Progress Notes (Signed)
 Orthopedic Spine Surgery Office Note   Assessment: Patient is a 53 y.o. male with neck pain that radiates into the left upper extremity consistent with cervical radiculopathy     Plan: -Patient has tried PT, Tylenol, gabapentin, Flexeril, baclofen, cervical steroid injections -Patient has tried multiple conservative treatments over the years without any relief of his pain, so discussed surgery as a treatment option for him.  After our discussion, patient elected to proceed -Patient will next be seen a date of surgery     The patient has cervical radiculopathy, which was initially treated with non-operative measures. The patient's symptoms failed to improve with conservative treatments, so operative management was discussed in the form of C4-7 anterior cervical discectomy and fusion.  His stenosis and symptoms seem most consistent with C4/5 and C5/6 stenosis. However, he has significant degenerative disc disease at C6/7, so I recommended inclusion of that level.  The risks, including but not limited to pseudarthrosis, dysphagia, hematoma, airway compromise, recurrent laryngeal nerve injury, esophageal perforation, durotomy, spinal cord injury, nerve root injury, persistent pain, adjacent segment disease, infection, bleeding, hardware failure, vascular injury, heart attack, death, stroke, fracture, and need for additional procedures were discussed with the patient. The benefit of surgery would be improvement in the patient's radiating arm pain. Explained that the patient may not get full relief of his pain with this surgery, especially any neck pain. The alternatives to surgical management would be continued monitoring, physical therapy, over-the-counter pain medications, injections, traction, and activity modification. All the patient's questions were answered to his satisfaction. After this discussion, the patient expressed understanding and elected to proceed with surgical intervention.       ___________________________________________________________________________     History:   Patient is a 53 y.o. male who presents today for follow-up on his cervical spine.  He is still having neck pain that radiates into the trapezius, lateral arm, dorsal forearm, and hand on the left side.  He feels it in the more ulnar digits.  He does not have any pain rating to the right upper extremity.  He has not noticed any new symptoms since he was last seen in the office a couple of days ago.  He comes in today to discuss options since he brought me the CD and I have had a chance to review his MRI.   Treatments tried: PT, Tylenol, gabapentin, Flexeril, baclofen, cervical steroid injections     Physical Exam:   General: no acute distress, appears stated age Neurologic: alert, answering questions appropriately, following commands Respiratory: unlabored breathing on room air, symmetric chest rise Psychiatric: appropriate affect, normal cadence to speech     MSK (spine):   -Strength exam                                                   Left                  Right Grip strength                5/5                  5/5 Interosseus                  5/5                  5/5  Wrist extension            5/5                  5/5 Wrist flexion                 5/5                  5/5 Elbow flexion                5/5                  5/5 Deltoid                          5/5                  5/5   EHL                              5/5                  5/5 TA                                 5/5                  5/5 GSC                             5/5                  5/5 Knee extension            5/5                  5/5 Hip flexion                    5/5                  5/5   -Sensory exam                           Sensation intact to light touch in L2-S1 nerve distributions of bilateral lower extremities             Sensation intact to light touch in C4-T1 nerve distributions of bilateral upper  extremities   -Brachioradialis DTR: 2/4 on the left, 2/4 on the right -Biceps DTR: 2/4 on the left, 2/4 on the right -Triceps DTR: 2/4 on the left, 2/4 on the right -Achilles DTR: 2/4 on the left, 2/4 on the right -Patellar tendon DTR: 2/4 on the left, 2/4 on the right   -Spurling: Negative bilaterally -Hoffman sign: Negative bilaterally -Clonus: No beats bilaterally -Interosseous wasting: None seen -Grip and release test: Negative -Romberg: Negative -Gait: Normal   Left shoulder exam: No pain through range of motion, negative Jobe, negative belly press, no weakness with external rotation with arm at side Right shoulder exam: No pain through range of motion   Imaging: XRs of the cervical spine from 10/27/2023 were previously independently reviewed and interpreted, showing disc height loss at C4/5, C5/6, and C6/7. Anterior osteophyte formation is seen at those levels as well. No fracture or dislocation seen. No evidence of instability on  flexion/extension views.    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 No cord expansion at those levels. Bilateral foraminal stenosis at C4/5. Left sided foraminal stenosis at C5/6. Bilateral foraminal stenosis at C6/7 (L>R). Right sided foraminal stenosis at C7/T1.  No significant central stenosis seen.      Patient name: Taylor Gordon Patient MRN: [id number] Date of visit: 10/30/23   Pre-operative Scores  NDI: 78 VAS neck: 10/10 VAS arm: 8/10

## 2024-01-15 NOTE — Progress Notes (Signed)
 Surgical Instructions   Your procedure is scheduled on January 23, 2024. Report to [facility] Main Entrance "A" at 5:30 A.M., then check in with the Admitting office. Any questions or running late day of surgery: call [phone]  Questions prior to your surgery date: call [phone], Monday-Friday, 8am-4pm. If you experience any cold or flu symptoms such as cough, fever, chills, shortness of breath, etc. between now and your scheduled surgery, please notify us  at the above number.     Remember:  Do not eat after midnight the night before your surgery  You may drink clear liquids until 4:30 the morning of your surgery.   Clear liquids allowed are: Water, Non-Citrus Juices (without pulp), Carbonated Beverages, Clear Tea (no milk, honey, etc.), Black Coffee Only (NO MILK, CREAM OR POWDERED CREAMER of any kind), and Gatorade. Patient Instructions  The night before surgery:  No food after midnight. ONLY clear liquids after midnight  The day of surgery (if you do NOT have diabetes):  Drink ONE (1) Pre-Surgery Clear Ensure by 4:30 the morning of surgery. Drink in one sitting. Do not sip.  This drink was given to you during your hospital  pre-op appointment visit.  Nothing else to drink after completing the  Pre-Surgery Clear Ensure.          If you have questions, please contact your surgeon's office.    Take these medicines the morning of surgery with A SIP OF WATER    May take these medicines IF NEEDED: acetaminophen  (TYLENOL )  rizatriptan (MAXALT)  Ubrogepant (UBRELVY    One week prior to surgery, STOP taking any Aspirin (unless otherwise instructed by your surgeon) Aleve, Naproxen, Ibuprofen, Motrin, Advil, Goody's, BC's, all herbal medications, fish oil, and non-prescription vitamins.                     Do NOT Smoke (Tobacco/Vaping) for 24 hours prior to your procedure.  If you use a CPAP at night, you may bring your mask/headgear for your overnight stay.   You will be  asked to remove any contacts, glasses, piercing's, hearing aid's, dentures/partials prior to surgery. Please bring cases for these items if needed.    Patients discharged the day of surgery will not be allowed to drive home, and someone needs to stay with them for 24 hours.  SURGICAL WAITING ROOM VISITATION Patients may have no more than 2 support people in the waiting area - these visitors may rotate.   Pre-op nurse will coordinate an appropriate time for 1 ADULT support person, who may not rotate, to accompany patient in pre-op.  Children under the age of 85 must have an adult with them who is not the patient and must remain in the main waiting area with an adult.  If the patient needs to stay at the hospital during part of their recovery, the visitor guidelines for inpatient rooms apply.  Please refer to the [facility] website for the visitor guidelines for any additional information.   If you received a COVID test during your pre-op visit  it is requested that you wear a mask when out in public, stay away from anyone that may not be feeling well and notify your surgeon if you develop symptoms. If you have been in contact with anyone that has tested positive in the last 10 days please notify you surgeon.      Pre-operative 5 CHG Bathing Instructions   You can play a key role in reducing the risk of  infection after surgery. Your skin needs to be as free of germs as possible. You can reduce the number of germs on your skin by washing with CHG (chlorhexidine gluconate) soap before surgery. CHG is an antiseptic soap that kills germs and continues to kill germs even after washing.   DO NOT use if you have an allergy to chlorhexidine/CHG or antibacterial soaps. If your skin becomes reddened or irritated, stop using the CHG and notify one of our RNs at [phone].   Please shower with the CHG soap starting 4 days before surgery using the following schedule:     Please keep in mind the  following:  DO NOT shave, including legs and underarms, starting the day of your first shower.   You may shave your face at any point before/day of surgery.  Place clean sheets on your bed the day you start using CHG soap. Use a clean washcloth (not used since being washed) for each shower. DO NOT sleep with pets once you start using the CHG.   CHG Shower Instructions:  Wash your face and private area with normal soap. If you choose to wash your hair, wash first with your normal shampoo.  After you use shampoo/soap, rinse your hair and body thoroughly to remove shampoo/soap residue.  Turn the water OFF and apply about 3 tablespoons (45 ml) of CHG soap to a CLEAN washcloth.  Apply CHG soap ONLY FROM YOUR NECK DOWN TO YOUR TOES (washing for 3-5 minutes)  DO NOT use CHG soap on face, private areas, open wounds, or sores.  Pay special attention to the area where your surgery is being performed.  If you are having back surgery, having someone wash your back for you may be helpful. Wait 2 minutes after CHG soap is applied, then you may rinse off the CHG soap.  Pat dry with a clean towel  Put on clean clothes/pajamas   If you choose to wear lotion, please use ONLY the CHG-compatible lotions that are listed below.  Additional instructions for the day of surgery: DO NOT APPLY any lotions, deodorants, cologne, or perfumes.   Do not bring valuables to the hospital. [facility] is not responsible for any belongings/valuables. Do not wear nail polish, gel polish, artificial nails, or any other type of covering on natural nails (fingers and toes) Do not wear jewelry or makeup Put on clean/comfortable clothes.  Please brush your teeth.  Ask your nurse before applying any prescription medications to the skin.     CHG Compatible Lotions   Aveeno Moisturizing lotion  Cetaphil Moisturizing Cream  Cetaphil Moisturizing Lotion  Clairol Herbal Essence Moisturizing Lotion, Dry Skin  Clairol Herbal  Essence Moisturizing Lotion, Extra Dry Skin  Clairol Herbal Essence Moisturizing Lotion, Normal Skin  Curel Age Defying Therapeutic Moisturizing Lotion with Alpha Hydroxy  Curel Extreme Care Body Lotion  Curel Soothing Hands Moisturizing Hand Lotion  Curel Therapeutic Moisturizing Cream, Fragrance-Free  Curel Therapeutic Moisturizing Lotion, Fragrance-Free  Curel Therapeutic Moisturizing Lotion, Original Formula  Eucerin Daily Replenishing Lotion  Eucerin Dry Skin Therapy Plus Alpha Hydroxy Crme  Eucerin Dry Skin Therapy Plus Alpha Hydroxy Lotion  Eucerin Original Crme  Eucerin Original Lotion  Eucerin Plus Crme Eucerin Plus Lotion  Eucerin TriLipid Replenishing Lotion  Keri Anti-Bacterial Hand Lotion  Keri Deep Conditioning Original Lotion Dry Skin Formula Softly Scented  Keri Deep Conditioning Original Lotion, Fragrance Free Sensitive Skin Formula  Keri Lotion Fast Absorbing Fragrance Free Sensitive Skin Formula  Keri Lotion Fast Absorbing Softly  Scented Dry Skin Formula  Keri Original Lotion  Keri Skin Renewal Lotion Keri Silky Smooth Lotion  Keri Silky Smooth Sensitive Skin Lotion  Nivea Body Creamy Conditioning Oil  Nivea Body Extra Enriched Lotion  Nivea Body Original Lotion  Nivea Body Sheer Moisturizing Lotion Nivea Crme  Nivea Skin Firming Lotion  NutraDerm 30 Skin Lotion  NutraDerm Skin Lotion  NutraDerm Therapeutic Skin Cream  NutraDerm Therapeutic Skin Lotion  ProShield Protective Hand Cream  Provon moisturizing lotion  Please read over the following fact sheets that you were given.

## 2024-01-16 ENCOUNTER — Other Ambulatory Visit: Payer: Self-pay

## 2024-01-16 ENCOUNTER — Encounter (HOSPITAL_COMMUNITY): Payer: Self-pay

## 2024-01-16 ENCOUNTER — Encounter (HOSPITAL_COMMUNITY)
Admission: RE | Admit: 2024-01-16 | Discharge: 2024-01-16 | Disposition: A | Discharge status: Home / Self Care | Source: Ambulatory Visit | Attending: Orthopedic Surgery | Admitting: Orthopedic Surgery

## 2024-01-16 DIAGNOSIS — Z01812 Encounter for preprocedural laboratory examination: Secondary | ICD-10-CM | POA: Diagnosis present

## 2024-01-16 DIAGNOSIS — Z01818 Encounter for other preprocedural examination: Secondary | ICD-10-CM

## 2024-01-16 HISTORY — DX: Sleep apnea, unspecified: G47.30

## 2024-01-16 HISTORY — DX: Unspecified asthma, uncomplicated: J45.909

## 2024-01-16 LAB — CBC
HCT: 46.1 % (ref 39.0–52.0)
Hemoglobin: 16.1 g/dL (ref 13.0–17.0)
MCH: 31.3 pg (ref 26.0–34.0)
MCHC: 34.9 g/dL (ref 30.0–36.0)
MCV: 89.5 fL (ref 80.0–100.0)
Platelets: 311 10*3/uL (ref 150–400)
RBC: 5.15 MIL/uL (ref 4.22–5.81)
RDW: 13 % (ref 11.5–15.5)
WBC: 6 10*3/uL (ref 4.0–10.5)
nRBC: 0 % (ref 0.0–0.2)

## 2024-01-16 LAB — SURGICAL PCR SCREEN
MRSA, PCR: NEGATIVE
Staphylococcus aureus: NEGATIVE

## 2024-01-16 LAB — BASIC METABOLIC PANEL WITH GFR
Anion gap: 7 (ref 5–15)
BUN: 8 mg/dL (ref 6–20)
CO2: 23 mmol/L (ref 22–32)
Calcium: 9.3 mg/dL (ref 8.9–10.3)
Chloride: 109 mmol/L (ref 98–111)
Creatinine, Ser: 0.97 mg/dL (ref 0.61–1.24)
GFR, Estimated: >60 mL/min (ref >60)
Glucose, Bld: 105 mg/dL — ABNORMAL HIGH (ref 70–99)
Potassium: 4 mmol/L (ref 3.5–5.1)
Sodium: 139 mmol/L (ref 135–145)

## 2024-01-16 LAB — TYPE AND SCREEN
ABO/RH(D): O POS
Antibody Screen: NEGATIVE

## 2024-01-16 NOTE — Progress Notes (Addendum)
 PCP - Dr. Debborah Fairly Cardiologist - Denies  PPM/ICD - Denies Device Orders - n/a Rep Notified - n/a  Chest x-ray - N/A EKG - 01-16-24 Stress Test - 1990's per patient stated was normal ECHO - Denies Cardiac Cath - Denies  Sleep Study - Yes, positive for OSA CPAP - Does not tolerate a cpap machine  NON-Diabetic  Last dose of GLP1 agonist-  Denies GLP1 instructions: n/a  Blood Thinner Instructions: Denies Aspirin Instructions:Denies  ERAS Protcol - Clears until 0430 PRE-SURGERY Ensure or G2- Ensure  COVID TEST- N/A   Anesthesia review: Yes, hypertension w/new EKG  Patient denies shortness of breath, fever, cough and chest pain at PAT appointment. Patient denies any respiratory issues at this time.    All instructions explained to the patient, with a verbal understanding of the material. Patient agrees to go over the instructions while at home for a better understanding. Patient also instructed to self quarantine after being tested for COVID-19. The opportunity to ask questions was provided.

## 2024-01-22 NOTE — H&P (Signed)
 Orthopedic Spine Surgery H&P Note  Assessment: Patient is a 53 y.o. male with cervical radiculopathy   Plan: -Out of bed as tolerated, activity as tolerated, no brace -Covered the risks of surgery one more time with the patient and patient elected to proceed with planned surgery -Written consent verified -Hold anticoagulation in anticipation of surgery -Ancef and TXA on all to OR -NPO for procedure -Site marked -To OR when ready  The risks covered this morning included but were not limited to: pseudarthrosis, dysphagia, hematoma, airway compromise, recurrent laryngeal nerve injury, esophageal perforation, durotomy, spinal cord injury, nerve root injury, persistent pain, adjacent segment disease, infection, bleeding, hardware failure, vascular injury, heart attack, death, stroke, fracture, dvt/pe, and need for additional procedures. I stated that the benefits would be improvement in his radiating arm pain but it would no predictably relieve any neck pain. I covered the fact that his symptoms have been going on for over 2 years now so he can expect improvement in his arm pain but not as much had he not had symptoms for so long. After our conversation this morning, the patient restated his desire to proceed with surgery.   ___________________________________________________________________________  Chief Complaint: left upper extremity pain  History: Patient is 53 y.o. adult who has been previously seen in the office for neck pain that radiates into his left upper extremity. Work up was consistent with cervical radiculopathy. His symptoms failed to improve with conservative treatment so operative management was discussed at the last office visit. The patient presents today with no changes in their symptoms since the last office visit. See previous office note for further details.    Review of systems: General: denies fevers and chills, myalgias Neurologic: denies recent changes in vision,  slurred speech Abdomen: denies nausea, vomiting, hematemesis Respiratory: denies cough, shortness of breath  Past medical history: Chronic pain HTN Migraines   Allergies: NSAIDs, prednisone   Past surgical history:  None   Social history: Denies use of nicotine product (smoking, vaping, patches, smokeless) Alcohol use: rare Denies recreational drug use  Family history: -reviewed and not pertinent to cervical radiculopathy   Physical Exam:  BMI of 24.0  General: no acute distress, appears stated age Neurologic: alert, answering questions appropriately, following commands Cardiovascular: regular rate, no cyanosis Respiratory: unlabored breathing on room air, symmetric chest rise Psychiatric: appropriate affect, normal cadence to speech   MSK (spine):  -Strength exam      Left  Right Grip strength                5/5  5/5 Interosseus   5/5   5/5 Wrist extension  4/5  5/5 Wrist flexion   4/5  5/5 Elbow extension  5/5  5/5 Elbow flexion   5/5  5/5 Deltoid    5/5  5/5  EHL    5/5  5/5 TA    5/5  5/5 GSC    5/5  5/5 Knee extension  5/5  5/5 Knee flexion   5/5  5/5 Hip flexion   5/5  5/5  -Sensory exam    Sensation intact to light touch in L2-S1 nerve distributions of bilateral lower extremities  Sensation intact to light touch in C4-T1 nerve distributions of bilateral upper extremities   Patient name: Taylor Gordon Patient MRN: [id number] Date: 01/23/2024

## 2024-01-23 ENCOUNTER — Ambulatory Visit (HOSPITAL_COMMUNITY): Payer: Self-pay | Admitting: Anesthesiology

## 2024-01-23 ENCOUNTER — Observation Stay (HOSPITAL_COMMUNITY)

## 2024-01-23 ENCOUNTER — Encounter (HOSPITAL_COMMUNITY): Payer: Self-pay | Admitting: Orthopedic Surgery

## 2024-01-23 ENCOUNTER — Ambulatory Visit (HOSPITAL_COMMUNITY)

## 2024-01-23 ENCOUNTER — Encounter (HOSPITAL_COMMUNITY)
Admission: RE | Disposition: A | Discharge status: Home / Self Care | Payer: Self-pay | Source: Home / Self Care | Attending: Orthopedic Surgery

## 2024-01-23 ENCOUNTER — Observation Stay (HOSPITAL_COMMUNITY)
Admission: RE | Admit: 2024-01-23 | Discharge: 2024-01-24 | Disposition: A | Discharge status: Home / Self Care | Attending: Orthopedic Surgery | Admitting: Orthopedic Surgery

## 2024-01-23 ENCOUNTER — Other Ambulatory Visit: Payer: Self-pay

## 2024-01-23 ENCOUNTER — Ambulatory Visit (HOSPITAL_COMMUNITY): Payer: Self-pay | Admitting: Physician Assistant

## 2024-01-23 DIAGNOSIS — M5412 Radiculopathy, cervical region: Secondary | ICD-10-CM | POA: Diagnosis present

## 2024-01-23 DIAGNOSIS — Z01818 Encounter for other preprocedural examination: Principal | ICD-10-CM

## 2024-01-23 DIAGNOSIS — Z79899 Other long term (current) drug therapy: Secondary | ICD-10-CM | POA: Insufficient documentation

## 2024-01-23 DIAGNOSIS — M4802 Spinal stenosis, cervical region: Principal | ICD-10-CM | POA: Insufficient documentation

## 2024-01-23 DIAGNOSIS — M50121 Cervical disc disorder at C4-C5 level with radiculopathy: Secondary | ICD-10-CM | POA: Diagnosis not present

## 2024-01-23 DIAGNOSIS — I10 Essential (primary) hypertension: Secondary | ICD-10-CM | POA: Diagnosis not present

## 2024-01-23 DIAGNOSIS — Z87891 Personal history of nicotine dependence: Secondary | ICD-10-CM | POA: Diagnosis not present

## 2024-01-23 DIAGNOSIS — J45909 Unspecified asthma, uncomplicated: Secondary | ICD-10-CM | POA: Insufficient documentation

## 2024-01-23 HISTORY — PX: ANTERIOR CERVICAL DECOMP/DISCECTOMY FUSION: SHX1161

## 2024-01-23 LAB — ABO/RH: ABO/RH(D): O POS

## 2024-01-23 SURGERY — ANTERIOR CERVICAL DECOMPRESSION/DISCECTOMY FUSION 3 LEVELS
Anesthesia: General | Site: Spine Cervical

## 2024-01-23 MED ORDER — FENTANYL CITRATE (PF) 250 MCG/5ML IJ SOLN
INTRAMUSCULAR | Status: AC
  Filled 2024-01-23: qty 5

## 2024-01-23 MED ORDER — FENTANYL CITRATE (PF) 250 MCG/5ML IJ SOLN
INTRAMUSCULAR | Status: DC | PRN
  Administered 2024-01-23 (×4): 50 ug via INTRAVENOUS

## 2024-01-23 MED ORDER — HYDROMORPHONE HCL 1 MG/ML IJ SOLN
INTRAMUSCULAR | Status: AC
  Filled 2024-01-23: qty 1

## 2024-01-23 MED ORDER — HYDROMORPHONE HCL 1 MG/ML IJ SOLN
INTRAMUSCULAR | Status: AC
  Filled 2024-01-23: qty 0.5

## 2024-01-23 MED ORDER — MEPERIDINE HCL 25 MG/ML IJ SOLN: 6.2500 mg | INTRAMUSCULAR | Status: DC | PRN

## 2024-01-23 MED ORDER — OXYCODONE HCL 5 MG PO TABS: 5.0000 mg | ORAL_TABLET | Freq: Once | ORAL | Status: AC | PRN

## 2024-01-23 MED ORDER — SODIUM CHLORIDE 0.9 % IV SOLN: 12.5000 mg | INTRAVENOUS | Status: DC | PRN

## 2024-01-23 MED ORDER — SENNA 8.6 MG PO TABS
1.0000 | ORAL_TABLET | Freq: Two times a day (BID) | ORAL | 0 refills | Status: AC
  Filled 2024-01-23: qty 28, 14d supply, fill #0

## 2024-01-23 MED ORDER — PROPOFOL 10 MG/ML IV BOLUS
INTRAVENOUS | Status: AC
  Filled 2024-01-23: qty 20

## 2024-01-23 MED ORDER — FENTANYL CITRATE (PF) 250 MCG/5ML IJ SOLN
INTRAMUSCULAR | Status: AC
Start: 2024-01-23 — End: ?
  Filled 2024-01-23: qty 5

## 2024-01-23 MED ORDER — DEXAMETHASONE SODIUM PHOSPHATE 10 MG/ML IJ SOLN
INTRAMUSCULAR | Status: DC | PRN
  Administered 2024-01-23: 10 mg via INTRAVENOUS

## 2024-01-23 MED ORDER — KETOROLAC TROMETHAMINE 15 MG/ML IJ SOLN: 15.0000 mg | Freq: Four times a day (QID) | INTRAMUSCULAR | Status: DC | PRN

## 2024-01-23 MED ORDER — POLYETHYLENE GLYCOL 3350 17 GM/SCOOP PO POWD
17.0000 g | Freq: Every day | ORAL | 0 refills | Status: AC
  Filled 2024-01-23: qty 238, 14d supply, fill #0

## 2024-01-23 MED ORDER — AMISULPRIDE (ANTIEMETIC) 5 MG/2ML IV SOLN: 10.0000 mg | Freq: Once | INTRAVENOUS | Status: DC | PRN

## 2024-01-23 MED ORDER — DEXMEDETOMIDINE HCL IN NACL 80 MCG/20ML IV SOLN
INTRAVENOUS | Status: DC | PRN
  Administered 2024-01-23: 8 ug via INTRAVENOUS

## 2024-01-23 MED ORDER — SENNA 8.6 MG PO TABS
1.0000 | ORAL_TABLET | Freq: Two times a day (BID) | ORAL | Status: DC
  Administered 2024-01-23 – 2024-01-24 (×2): 8.6 mg via ORAL
  Filled 2024-01-23 (×2): qty 1

## 2024-01-23 MED ORDER — HYDROMORPHONE HCL 1 MG/ML IJ SOLN
0.2500 mg | INTRAMUSCULAR | Status: DC | PRN
  Administered 2024-01-23: 0.25 mg via INTRAVENOUS
  Administered 2024-01-23: 0.5 mg via INTRAVENOUS
  Administered 2024-01-23: 0.25 mg via INTRAVENOUS

## 2024-01-23 MED ORDER — OXYCODONE HCL 5 MG/5ML PO SOLN
5.0000 mg | Freq: Once | ORAL | Status: AC | PRN
  Administered 2024-01-23: 5 mg via ORAL

## 2024-01-23 MED ORDER — PHENYLEPHRINE 80 MCG/ML (10ML) SYRINGE FOR IV PUSH (FOR BLOOD PRESSURE SUPPORT)
PREFILLED_SYRINGE | INTRAVENOUS | Status: DC | PRN
  Administered 2024-01-23: 180 ug via INTRAVENOUS
  Administered 2024-01-23: 160 ug via INTRAVENOUS
  Administered 2024-01-23: 80 ug via INTRAVENOUS

## 2024-01-23 MED ORDER — ONDANSETRON HCL 4 MG PO TABS: 4.0000 mg | ORAL_TABLET | Freq: Four times a day (QID) | ORAL | Status: DC | PRN

## 2024-01-23 MED ORDER — OXYCODONE HCL 5 MG PO TABS
5.0000 mg | ORAL_TABLET | ORAL | 0 refills | Status: AC | PRN
  Filled 2024-01-23: qty 40, 4d supply, fill #0

## 2024-01-23 MED ORDER — UBROGEPANT 100 MG PO TABS: 100.0000 mg | ORAL_TABLET | Freq: Every day | ORAL | Status: DC | PRN

## 2024-01-23 MED ORDER — HYDROMORPHONE HCL 1 MG/ML IJ SOLN
INTRAMUSCULAR | Status: DC | PRN
  Administered 2024-01-23 (×3): .5 mg via INTRAVENOUS

## 2024-01-23 MED ORDER — ROCURONIUM BROMIDE 10 MG/ML (PF) SYRINGE
PREFILLED_SYRINGE | INTRAVENOUS | Status: DC | PRN
  Administered 2024-01-23 (×2): 30 mg via INTRAVENOUS
  Administered 2024-01-23: 70 mg via INTRAVENOUS
  Administered 2024-01-23 (×2): 30 mg via INTRAVENOUS

## 2024-01-23 MED ORDER — POLYETHYLENE GLYCOL 3350 17 G PO PACK
17.0000 g | PACK | Freq: Every day | ORAL | Status: DC
  Filled 2024-01-23 (×2): qty 1

## 2024-01-23 MED ORDER — HYDROMORPHONE HCL 1 MG/ML IJ SOLN
INTRAMUSCULAR | Status: AC
Start: 2024-01-23 — End: ?
  Filled 2024-01-23: qty 0.5

## 2024-01-23 MED ORDER — ONDANSETRON HCL 4 MG/2ML IJ SOLN: 4.0000 mg | Freq: Four times a day (QID) | INTRAMUSCULAR | Status: DC | PRN

## 2024-01-23 MED ORDER — ONDANSETRON HCL 4 MG/2ML IJ SOLN
INTRAMUSCULAR | Status: DC | PRN
  Administered 2024-01-23: 4 mg via INTRAVENOUS

## 2024-01-23 MED ORDER — SUMATRIPTAN SUCCINATE 50 MG PO TABS
50.0000 mg | ORAL_TABLET | ORAL | Status: DC | PRN
  Administered 2024-01-23 (×2): 50 mg via ORAL
  Filled 2024-01-23 (×3): qty 1

## 2024-01-23 MED ORDER — ZONISAMIDE 25 MG PO CAPS
150.0000 mg | ORAL_CAPSULE | Freq: Every day | ORAL | Status: DC
  Filled 2024-01-23 (×2): qty 6

## 2024-01-23 MED ORDER — LIDOCAINE 2% (20 MG/ML) 5 ML SYRINGE
INTRAMUSCULAR | Status: DC | PRN
  Administered 2024-01-23: 80 mg via INTRAVENOUS

## 2024-01-23 MED ORDER — POVIDONE-IODINE 10 % EX SWAB
2.0000 | Freq: Once | CUTANEOUS | Status: AC
  Administered 2024-01-23: 2 via TOPICAL

## 2024-01-23 MED ORDER — PROPOFOL 10 MG/ML IV BOLUS
INTRAVENOUS | Status: DC | PRN
  Administered 2024-01-23: 200 mg via INTRAVENOUS

## 2024-01-23 MED ORDER — LOSARTAN POTASSIUM 50 MG PO TABS
50.0000 mg | ORAL_TABLET | Freq: Every day | ORAL | Status: DC
  Administered 2024-01-24: 50 mg via ORAL
  Filled 2024-01-23: qty 1

## 2024-01-23 MED ORDER — OXYCODONE HCL 5 MG PO TABS
5.0000 mg | ORAL_TABLET | ORAL | Status: DC | PRN
  Administered 2024-01-23 – 2024-01-24 (×3): 10 mg via ORAL
  Filled 2024-01-23 (×4): qty 2

## 2024-01-23 MED ORDER — ACETAMINOPHEN 500 MG PO TABS
1000.0000 mg | ORAL_TABLET | Freq: Three times a day (TID) | ORAL | 0 refills | Status: AC
Start: 2024-01-23 — End: 2024-02-14
  Filled 2024-01-23: qty 126, 21d supply, fill #0

## 2024-01-23 MED ORDER — CHLORHEXIDINE GLUCONATE 0.12 % MT SOLN
15.0000 mL | Freq: Once | OROMUCOSAL | Status: AC
  Administered 2024-01-23: 15 mL via OROMUCOSAL
  Filled 2024-01-23: qty 15

## 2024-01-23 MED ORDER — SUGAMMADEX SODIUM 200 MG/2ML IV SOLN
INTRAVENOUS | Status: DC | PRN
  Administered 2024-01-23: 200 mg via INTRAVENOUS

## 2024-01-23 MED ORDER — LIDOCAINE 2% (20 MG/ML) 5 ML SYRINGE
INTRAMUSCULAR | Status: AC
Start: 2024-01-23 — End: ?
  Filled 2024-01-23: qty 5

## 2024-01-23 MED ORDER — CEFAZOLIN SODIUM-DEXTROSE 2-4 GM/100ML-% IV SOLN
2.0000 g | INTRAVENOUS | Status: AC
  Administered 2024-01-23 (×2): 2 g via INTRAVENOUS
  Filled 2024-01-23: qty 100

## 2024-01-23 MED ORDER — MIDAZOLAM HCL 2 MG/2ML IJ SOLN
INTRAMUSCULAR | Status: AC
  Filled 2024-01-23: qty 2

## 2024-01-23 MED ORDER — TRANEXAMIC ACID-NACL 1000-0.7 MG/100ML-% IV SOLN
1000.0000 mg | INTRAVENOUS | Status: AC
  Administered 2024-01-23: 1000 mg via INTRAVENOUS
  Filled 2024-01-23: qty 100

## 2024-01-23 MED ORDER — MIDAZOLAM HCL 2 MG/2ML IJ SOLN
INTRAMUSCULAR | Status: DC | PRN
  Administered 2024-01-23: 2 mg via INTRAVENOUS

## 2024-01-23 MED ORDER — METHOCARBAMOL 500 MG PO TABS
500.0000 mg | ORAL_TABLET | Freq: Four times a day (QID) | ORAL | 0 refills | Status: AC | PRN
  Filled 2024-01-23: qty 40, 10d supply, fill #0

## 2024-01-23 MED ORDER — EPHEDRINE SULFATE-NACL 50-0.9 MG/10ML-% IV SOSY
PREFILLED_SYRINGE | INTRAVENOUS | Status: DC | PRN
  Administered 2024-01-23 (×2): 10 mg via INTRAVENOUS

## 2024-01-23 MED ORDER — CEFAZOLIN SODIUM-DEXTROSE 2-4 GM/100ML-% IV SOLN
2.0000 g | Freq: Four times a day (QID) | INTRAVENOUS | Status: AC
  Administered 2024-01-23 (×2): 2 g via INTRAVENOUS
  Filled 2024-01-23 (×2): qty 100

## 2024-01-23 MED ORDER — ORAL CARE MOUTH RINSE: 15.0000 mL | Freq: Once | OROMUCOSAL | Status: AC

## 2024-01-23 MED ORDER — PHENYLEPHRINE HCL-NACL 20-0.9 MG/250ML-% IV SOLN
INTRAVENOUS | Status: DC | PRN
  Administered 2024-01-23: 40 ug/min via INTRAVENOUS

## 2024-01-23 MED ORDER — ALBUMIN HUMAN 5 % IV SOLN: INTRAVENOUS | Status: DC | PRN

## 2024-01-23 MED ORDER — ACETAMINOPHEN 500 MG PO TABS
1000.0000 mg | ORAL_TABLET | Freq: Three times a day (TID) | ORAL | Status: DC
  Administered 2024-01-23 – 2024-01-24 (×3): 1000 mg via ORAL
  Filled 2024-01-23 (×3): qty 2

## 2024-01-23 MED ORDER — TRANEXAMIC ACID-NACL 1000-0.7 MG/100ML-% IV SOLN
1000.0000 mg | Freq: Once | INTRAVENOUS | Status: AC
  Administered 2024-01-23: 1000 mg via INTRAVENOUS
  Filled 2024-01-23: qty 100

## 2024-01-23 MED ORDER — OXYCODONE HCL 5 MG/5ML PO SOLN
ORAL | Status: AC
Start: 2024-01-23 — End: ?
  Filled 2024-01-23: qty 5

## 2024-01-23 MED ORDER — ZONISAMIDE 25 MG PO CAPS
150.0000 mg | ORAL_CAPSULE | Freq: Every day | ORAL | Status: DC
  Administered 2024-01-23: 150 mg via ORAL
  Filled 2024-01-23 (×2): qty 2

## 2024-01-23 MED ORDER — LACTATED RINGERS IV SOLN
INTRAVENOUS | Status: DC | PRN
Start: 2024-01-23 — End: 2024-01-23

## 2024-01-23 MED ORDER — LACTATED RINGERS IV SOLN: INTRAVENOUS | Status: DC

## 2024-01-23 MED ORDER — 0.9 % SODIUM CHLORIDE (POUR BTL) OPTIME
TOPICAL | Status: DC | PRN
  Administered 2024-01-23: 1000 mL

## 2024-01-23 MED ORDER — PROPOFOL 10 MG/ML IV BOLUS
INTRAVENOUS | Status: AC
Start: 2024-01-23 — End: ?
  Filled 2024-01-23: qty 20

## 2024-01-23 MED ORDER — ROCURONIUM BROMIDE 10 MG/ML (PF) SYRINGE
PREFILLED_SYRINGE | INTRAVENOUS | Status: AC
Start: 2024-01-23 — End: ?
  Filled 2024-01-23: qty 10

## 2024-01-23 SURGICAL SUPPLY — 67 items
ALCOHOL 70% 16 OZ (MISCELLANEOUS) ×1 IMPLANT
ALLOGRAFT TRIAD LORDOTIC CC (Bone Implant) IMPLANT
BAG COUNTER SPONGE SURGICOUNT (BAG) ×1 IMPLANT
BAND RUBBER #18 3X1/16 STRL (MISCELLANEOUS) ×2 IMPLANT
BENZOIN TINCTURE PRP APPL 2/3 (GAUZE/BANDAGES/DRESSINGS) IMPLANT
BIT DRILL ACP 15 (DRILL) IMPLANT
BLADE CLIPPER SURG (BLADE) IMPLANT
BUR MATCHSTICK NEURO 3.0 LAGG (BURR) ×1 IMPLANT
CABLE BIPOLOR RESECTION CORD (MISCELLANEOUS) ×1 IMPLANT
CANISTER SUCTION 3000ML PPV (SUCTIONS) ×1 IMPLANT
CLSR STERI-STRIP ANTIMIC 1/2X4 (GAUZE/BANDAGES/DRESSINGS) ×1 IMPLANT
COVER MAYO STAND STRL (DRAPES) ×2 IMPLANT
COVER SURGICAL LIGHT HANDLE (MISCELLANEOUS) ×2 IMPLANT
DRAIN PENROSE 0.25X18 (DRAIN) IMPLANT
DRAPE C-ARM 42X72 X-RAY (DRAPES) ×1 IMPLANT
DRAPE LAPAROTOMY 100X72X124 (DRAPES) ×1 IMPLANT
DRAPE MICROSCOPE LEICA (MISCELLANEOUS) ×1 IMPLANT
DRAPE MICROSCOPE NONGLARE (MISCELLANEOUS) ×1 IMPLANT
DRAPE POUCH INSTRU U-SHP 10X18 (DRAPES) ×1 IMPLANT
DRAPE SURG 17X11 SM STRL (DRAPES) ×4 IMPLANT
DRAPE SURG 17X23 STRL (DRAPES) ×1 IMPLANT
DRAPE U-SHAPE 47X51 STRL (DRAPES) ×1 IMPLANT
DRAPE UTILITY XL STRL (DRAPES) ×1 IMPLANT
DRSG OPSITE POSTOP 3X4 (GAUZE/BANDAGES/DRESSINGS) ×1 IMPLANT
DURAPREP 26ML APPLICATOR (WOUND CARE) ×1 IMPLANT
ELECT COATED BLADE 2.86 ST (ELECTRODE) ×1 IMPLANT
ELECTRODE BLADE INSULATED 4IN (ELECTROSURGICAL) ×1 IMPLANT
ELECTRODE REM PT RTRN 9FT ADLT (ELECTROSURGICAL) ×1 IMPLANT
GAUZE SPONGE 4X4 12PLY STRL (GAUZE/BANDAGES/DRESSINGS) IMPLANT
GLOVE BIO SURGEON STRL SZ7.5 (GLOVE) ×2 IMPLANT
GLOVE BIOGEL M STRL SZ7.5 (GLOVE) IMPLANT
GLOVE INDICATOR 7.5 STRL GRN (GLOVE) ×1 IMPLANT
GOWN STRL REUS W/ TWL LRG LVL3 (GOWN DISPOSABLE) ×1 IMPLANT
GOWN STRL SURGICAL XL XLNG (GOWN DISPOSABLE) ×1 IMPLANT
KIT BASIN OR (CUSTOM PROCEDURE TRAY) ×1 IMPLANT
KIT TURNOVER KIT B (KITS) ×1 IMPLANT
NDL HYPO 22X1.5 SAFETY MO (MISCELLANEOUS) ×1 IMPLANT
NDL SPNL 18GX3.5 QUINCKE PK (NEEDLE) ×1 IMPLANT
NEEDLE HYPO 22X1.5 SAFETY MO (MISCELLANEOUS) ×1 IMPLANT
NEEDLE SPNL 18GX3.5 QUINCKE PK (NEEDLE) ×1 IMPLANT
NS IRRIG 1000ML POUR BTL (IV SOLUTION) ×1 IMPLANT
PACK ORTHO CERVICAL (CUSTOM PROCEDURE TRAY) ×1 IMPLANT
PACK UNIVERSAL I (CUSTOM PROCEDURE TRAY) ×1 IMPLANT
PAD ARMBOARD POSITIONER FOAM (MISCELLANEOUS) ×2 IMPLANT
PENCIL BUTTON HOLSTER BLD 10FT (ELECTRODE) ×1 IMPLANT
PIN DISTRACTION MAXCESS-C 12 (PIN) IMPLANT
PLATE ACP 1.9X58 3LVL (Plate) IMPLANT
POSITIONER HEAD DONUT 9IN (MISCELLANEOUS) ×1 IMPLANT
RESTRAINT LIMB HOLDER UNIV (RESTRAINTS) ×1 IMPLANT
SCREW ACP 3.5X17 S/D VARIA (Screw) IMPLANT
SCREW ACP VA SD 3.5X15 (Screw) IMPLANT
SPONGE INTESTINAL PEANUT (DISPOSABLE) ×1 IMPLANT
SPONGE SURGIFOAM ABS GEL SZ50 (HEMOSTASIS) ×1 IMPLANT
SURGIFLO W/THROMBIN 8M KIT (HEMOSTASIS) IMPLANT
SUT BONE WAX W31G (SUTURE) ×1 IMPLANT
SUT MNCRL AB 3-0 PS2 27 (SUTURE) ×1 IMPLANT
SUT SILK 2 0 TIES 10X30 (SUTURE) IMPLANT
SUT VIC AB 2-0 CT2 18 VCP726D (SUTURE) ×1 IMPLANT
SYR BULB IRRIG 60ML STRL (SYRINGE) ×1 IMPLANT
TAPE CLOTH 4X10 WHT NS (GAUZE/BANDAGES/DRESSINGS) ×1 IMPLANT
TAPE CLOTH SURG 4X10 WHT LF (GAUZE/BANDAGES/DRESSINGS) IMPLANT
TOWEL GREEN STERILE (TOWEL DISPOSABLE) ×1 IMPLANT
TOWEL GREEN STERILE FF (TOWEL DISPOSABLE) ×1 IMPLANT
TRAY FOLEY W/BAG SLVR 14FR LF (SET/KITS/TRAYS/PACK) IMPLANT
TRAY FOLEY W/BAG SLVR 16FR ST (SET/KITS/TRAYS/PACK) ×1 IMPLANT
TUBING FEATHERFLOW (TUBING) ×1 IMPLANT
WATER STERILE IRR 1000ML POUR (IV SOLUTION) ×1 IMPLANT

## 2024-01-23 NOTE — Op Note (Signed)
 Orthopedic Spine Surgery Operative Report  Procedure: C4/5, C5/6, C6/7 anterior cervical discectomy and fusion C4/5, C5/6, C6/7 placement of structural allograft interbody spacer C4, C5, C6, C7 anterior plate instrumentation Intra-operative use of microscope  Modifier: none  Date of procedure: 01/23/2024  Patient name: Taylor Gordon MRN: [id number] DOB: 01/07/1971  Surgeon: Colette Davies, MD Assistant: none Pre-operative diagnosis: cervical radiculopathy, cervical foraminal stenosis Post-operative diagnosis: same as above Findings: C4/5, C5/6, C6/7 degenerative discs with anterior osteophyte formation  Specimens: none Anesthesia: general EBL: 30cc Complications: none Pre-incision antibiotic: ancef Pre-incision decadron: 10mg  TXA was given prior to incision  Implants:  Implant Name Type Inv. Item Serial No. Manufacturer Lot No. LRB No. Used Action  [employer] - [id number] Bone Implant [employer] [id number] [employer]  N/A 1 Implanted  [employer] - [id number] Bone Implant [employer] [id number] [employer]  N/A 1 Implanted  [employer] - [id number] Bone Implant [employer] [id number] [employer]  N/A 1 Implanted  SCREW ACP VA SD 3.5X15 - [id number] Screw SCREW ACP VA SD 3.5X15  [employer]  N/A 1 Implanted  SCREW ACP 3.5X17 S/D VARIA - [id number] Screw SCREW ACP 3.5X17 S/D VARIA  [employer]  N/A 17 Implanted  PLATE ACP 5.5D32 3LVL - [id number] Plate PLATE ACP 3.7S28 3LVL  [employer]  N/A 1 Implanted      Indication for procedure: Patient is a 53 y.o. adult who presented to the office with symptoms consistent with cervical radiculopathy. The patient had tried conservative treatments that did not provide any lasting relief. As result, operative management was discussed. The pre-operative MRI showed multilevel foraminal stenosis so a C4-7 anterior cervical  discectomy and fusion was presented as a treatment option. The risks, including but not limited to pseudarthrosis, dysphagia, hematoma, airway compromise, recurrent laryngeal nerve injury, esophageal perforation, durotomy, spinal cord injury, nerve root injury, persistent pain, adjacent segment disease, infection, bleeding, hardware failure, vascular injury, heart attack, death, stroke, fracture, dvt/pe, and need for additional procedures were discussed with the patient. The benefit of surgery would be improvement in the patient's radiating arm pain. The alternatives to surgical management would be continued monitoring, physical therapy, over-the-counter pain medications, injections, traction, injections, and activity modification. All the patient's questions were answered to his satisfaction. After this discussion, the patient expressed understanding and elected to proceed with surgical intervention.  Procedure Description: The patient was met in the pre-operative holding area. The patient's identity and consent were verified. The operative site was marked. The patient's remaining questions about the surgery were answered. The patient was brought back to the operating room. General anesthesia was induced and an endotracheal tube was placed by the anesthesia staff. The patient was transferred to the flat top Garrison table in the supine position. All bony prominences were well padded. A bump was placed underneath the patient's shoulders to extend the neck slightly. The patient's shoulders were than taped to the bed to improve the fluoroscopic visualization during the procedure. His facial hair over the neck was shaved with an Neurosurgeon. The surgical area was cleansed with alcohol. Fluoroscopy was then brought in to check rotation on the AP image and to mark the levels on the lateral image. The patient's skin was then prepped and draped in a standard, sterile fashion. A time out was performed that identified  the patient, the procedure, and the operative levels. All team members agreed with what was stated in the  time out.   A left-sided transverse incision was made in the middle of the previously marked levels. Incision was taken sharply down through the skin and dermis. Electrocautery was used to dissect down to the level of the platysma. A Metzenbaum scissors was used to elevate flaps both cranially and caudally above the platysma. A small opening was made in the platysma with the Metzenbaum scissors. The scissors were then placed under the platysma and electrocautery was used on top of the scissors to split the platysma. Blunt dissection was carried out medial to the sternocleidomastoid to identify the omohyoid. The omohyoid was then dissected over its lateral aspect with the Metzenbaum scissors. An appendiceal retractor was placed around the carotid sheath to retract it laterally and a cloward retractor was placed medially to retractor the esophagus and trachea medially. The interval between these structures was then bluntly dissected with the Metzenbaum scissors. At this point, the prevertebral fascia was visible within the wound. A kittner was used to dissect the prevertebral fascia off the vertebral bodies and discs. A bayonetted needle was placed into the disc thought to be the correct level. A lateral fluoroscopic shot was then used to confirm the C5/6 level. Once the level was identified, electrocautery was used to mark the disc space. The retractors that were previously placed were then put back into the wound. Electrocautery was used to elevate the longus coli off the bone on each side from C4-C7.  Shadowline retractor blades were then placed under the longus coli on each side and the self-retainer was attached to these retractor blades. The operative microscope was then brought in to improve visualization and lighting. A long handle fifteen blade was then used to incise the C4/5 disc space along the  endplates and longitudinally to connect these end plate incisions to create a rectangular incision in the disc. A pituitary was used to remove the incised disc material. A 2 kerrison was used to remove the anterior osteophyte and square the inferior endplate of the cranial vertebra. A combination of straight and curved curettes were used to remove further disc material and the cartilaginous aspect of the endplates. This was done until both the inferior and superior endplates had been prepared from the uncus to uncus and from ventral vertebral body to the posterior osteophytes. A high speed burr was then used to remove the posterior vertebral osteophytes. Once the PLL was visualized within the wound, a nerve hook was used to develop the plane between the PLL and the dura. While lifting up the PLL with the nerve hook, a 1 kerrison was placed under the PLL and used to remove the PLL. Once some of the PLL had been removed, a combination of 1 and 2 kerrisons were used to continue removing the PLL until it had been completely removed. A curved curette was then placed into the foramen and pointed ventrally. It was used to remove soft tissue and the deep aspect of the uncus. A nerve hook was then easily passed into the foramen. The same process was repeated for the other foramen. Lateral fluoroscopic imaging was used to guide the insertion of trials into the disc space in a serial fashion, starting with the smallest trial. Lateral fluoroscopic images were obtained and confirmed satisfactory position of the trial. A 6 was selected for use as the final implant.  The structural allograft interbody spacer was then placed into the prepared C4/5 disc space and lightly malleted into place. AP and lateral fluoroscopic images was obtained and  confirmed satisfactory position of the final implant.  The retractor blades were removed from the wound. Then, the same steps in the above paragraph were repeated for the C5/6 and C6/7 disc  spaces. The same size allograft interbody spacers were used at these levels. AP and lateral fluoroscopic images of the entire C spine were obtained which showed satisfactory positioning of all the interbody implants.   The microscope was then moved away from the operative field and the retractor blades were removed from the wound. A cloward was then used to retract the esophagus and trachea away from the ventral vertebral bodies and another was used to retract the carotid and sternocleidomastoid laterally. A 58mm titanium plate was then selected and placed over the ventral aspects of the vertebral bodies. No soft tissue structures were underneath the plate. An awl was put into the wound and put into one of the screw holes in the plate. It was aimed slightly medially. A 15mm screw was then inserted into the hole created with the awl. Decision was made to use a 17mm screw for the other screws since there was room for a longer screw based on where the 15mm was in the vertebral body. The same steps were repeated with the awl and screw to place 17mm sized screws in the remaining holes within the plate. Again, the wound was checked and no soft tissue structures were seen underneath the plate.   Final AP and lateral fluoroscopic films were taken and confirmed satisfactory position of the plate and interbody implants. The screws were final tightened. Hemostasis was achieved. A penrose drain was placed into the wound. The platysma was reapproximated using 2-0 vicryl. The deep dermal layer was closed with 2-0 vicryl. The skin was closed with a 3-0 monocryl. All counts were correct at the end of the procedure. Benzoine and steri strips were used over the incision area. The wound was dressed with 4x4 gauze and paper tape. The patient was awakened from anesthesia and transferred to a bed where a hard cervical collar was applied. The patient was brought back to the post-anesthesia care unit in stable condition by the  anesthesia staff.   Post-operative plan: The patient will recover in the post-anesthesia care unit and then go to the floor. The patient will receive two post-operative doses of ancef. He will get another dose of TXA. The patient will be out of bed as tolerated in a cervical collar. The patient will work with physical therapy. The drain will be removed on post-operative day one. The patient will likely discharge to home tomorrow.        Colette Davies, MD Orthopedic Surgeon

## 2024-01-23 NOTE — Transfer of Care (Signed)
 Immediate Anesthesia Transfer of Care Note  Patient: Taylor Gordon  Procedure(s) Performed: ANTERIOR CERVICAL DECOMPRESSION/DISCECTOMY FUSION 3 LEVELS (Spine Cervical)  Patient Location: PACU  Anesthesia Type:General  Level of Consciousness: sedated, drowsy, and patient cooperative  Airway & Oxygen Therapy: Patient Spontanous Breathing and Patient connected to nasal cannula oxygen  Post-op Assessment: Report given to RN and Post -op Vital signs reviewed and stable  Post vital signs: Reviewed and stable  Last Vitals:  Vitals Value Taken Time  BP 117/73 01/23/24 1420  Temp 37.7 C 01/23/24 1420  Pulse 105 01/23/24 1423  Resp 15 01/23/24 1423  SpO2 100 % 01/23/24 1423  Vitals shown include unfiled device data.  Last Pain:  Vitals:   01/23/24 0607  TempSrc:   PainSc: 7       Patients Stated Pain Goal: 3 (01/23/24 1610)  Complications: No notable events documented.

## 2024-01-23 NOTE — Brief Op Note (Signed)
 01/23/2024  3:19 PM  PATIENT:  Taylor Gordon  53 y.o. adult  PRE-OPERATIVE DIAGNOSIS:  CERVICAL RADICULOPATHY  POST-OPERATIVE DIAGNOSIS:  CERVICAL RADICULOPATHY  PROCEDURE:  Procedure(s) with comments: ANTERIOR CERVICAL DECOMPRESSION/DISCECTOMY FUSION 3 LEVELS (N/A) - C4-5, C5-6, C6-7 ANTERIOR CERVICAL DISCECTOMY AND FUSION  SURGEON:  Surgeons and Role:    * Diedra Fowler, MD - Primary  PHYSICIAN ASSISTANT: none  ASSISTANTS: none   ANESTHESIA:   general  EBL:  30cc  BLOOD ADMINISTERED:none  DRAINS: Penrose drain in the neck   LOCAL MEDICATIONS USED:  NONE  SPECIMEN:  No Specimen  DISPOSITION OF SPECIMEN:  N/A  COUNTS:  YES  TOURNIQUET:  NONE  DICTATION: .Note written in EPIC  PLAN OF CARE: Admit for overnight observation  PATIENT DISPOSITION:  PACU - hemodynamically stable.   Delay start of Pharmacological VTE agent (>24hrs) due to surgical blood loss or risk of bleeding: yes

## 2024-01-23 NOTE — Discharge Instructions (Signed)
 Orthopedic Surgery Discharge Instructions  Patient name: Taylor Gordon Procedure Performed: C4-7 anterior cervical discectomy and fusion Date of Surgery: 01/23/2024 Surgeon: Colette Davies, MD  Pre-operative Diagnosis: cervical radiculopathy Post-operative Diagnosis: same as above  Discharged to: home Discharge Condition: stable  Activity: You should wear your cervical collar for six weeks after surgery. It is okay to remove the cervical collar when you are taking a shower, but you should have it on at all other times. You should refrain from bending, lifting, or twisting with objects greater than ten pounds until three months after surgery. You are encouraged to walk as much as desired. You can perform household activities such as cleaning dishes, doing laundry, vacuuming, etc. as long as the ten-pound restriction is followed.  Incision Care: Your incision site has a dressing over it. That dressing should remain in place and dry at all times for a total of one week after surgery. After one week, you can remove the dressing. Underneath the dressing, you will find pieces of tape. You should leave these pieces of tape in place. They will fall off with time. Do not pick, rub, or scrub at them. Do not put cream or lotion over the surgical area. After one week and once the dressing is off, it is okay to let soap and water run over your incision. Again, do not pick, scrub, or rub at the pieces of tape when bathing. Do not submerge (e.g., take a bath, swim, go in a hot tub, etc.) until six weeks after surgery. There may be some bloody drainage from the incision into the dressing after surgery. This is normal. You do not need to replace the dressing. Continue to leave it in place for the one week as instructed above. Should the dressing become saturated with blood or drainage, please call the office for further instructions.   Medications: You have been prescribed oxycodone. This is a narcotic pain  medication and should only be taken as prescribed. You should not drink alcohol or operate heavy machinery (including driving) while taking this medication. The oxycodone can cause constipation as a side effect. For that reason, you have been prescribed senna and miralax. These are both laxatives. You do not need to take this medication if you develop diarrhea. Should you remain constipated even while taking these medications, please increase the dose of miralax to twice daily. Tylenol  has been prescribed to be taken every 8 hours, which will give you additional pain relief. Robaxin is a muscle relaxer that has been prescribed to you for muscle spasm type pain. Take this medication as needed.   Do not take NSAIDs (ibuprofen, Aleve, Celebrex, naproxen, meloxicam, etc.) for the first 6 weeks after surgery as there is some evidence that their use may decrease the chances of successful fusion.   In order to set expectations for opioid prescriptions, you will only be prescribed opioids for a total of six weeks after surgery and, at two-weeks after surgery, your opioid prescription will start to tapered (decreased dosage and number of pills). If you have ongoing need for opioid medication six weeks after surgery, you will be referred to pain management. If you are already established with a provider that is giving you opioid medications, you should schedule an appointment with them for six weeks after surgery if you feel you are going to need another prescription. State law only allows for opioid prescriptions one week at a time. If you are running out of opioid medication near the end of  the week, please call the office during business hours before running out so I can send you another prescription.   You may resume any home blood thinners (aspirin, warfarin, lovenox, apixaban, plavix, xarelto, etc.) 72 hours after your surgery. Take these medications as they were previously prescribed.   Driving: You should not  drive while taking narcotic pain medications. You should also not drive if you feel you cannot turn your body enough to check your blind spots while in your cervical collar. In this case, you should wait six weeks to drive when your cervical collar will be discontinued. You should start getting back to driving slowly and you may want to try driving in a parking lot before doing anything more. Please be mindful that in some states it is illegal to drive with a cervical collar in place. You should check your local laws before driving and you may have to wait until the collar is discontinued before driving.  Diet: You are safe to resume your regular diet. A common complication after cervical spine surgery is trouble swallowing especially if the incision was made over the front part of your neck. This complication happens often and, the vast majority of the time, it is a temporary problem that gets better with time. The first few days after surgery, you should take more bites than normal to break the food into smaller pieces before swallowing. With time as the swallowing becomes easier, you can gradually get back to taking bites like you normally would.   Reasons to Call the Office After Surgery: You should feel free to call the office with any concerns or questions you have in the post-operative period, but you should definitely notify the office if you develop: -shortness of breath, chest pain, or trouble breathing -excessive bleeding, drainage, redness, or swelling around the surgical site -fevers, chills, or pain that is getting worse with each passing day -persistent nausea or vomiting -new weakness in any extremity, new or worsening numbness or tingling in any extremity -numbness in the groin, bowel or bladder incontinence -other concerns about your surgery  Follow Up Appointments: You should have an office appointment scheduled for approximately two weeks after surgery. If you do not remember when  this appointment is or do not already have it scheduled, please call the office to schedule.   Office Information:  -Colette Davies, MD -Phone number: [phone] -Address: [address]       [place], [place] [address]

## 2024-01-23 NOTE — Anesthesia Procedure Notes (Signed)
 Procedure Name: Intubation Date/Time: 01/23/2024 7:48 AM  Performed by: Loreda Rodriguez, CRNAPre-anesthesia Checklist: Patient identified, Emergency Drugs available, Suction available and Patient being monitored Patient Re-evaluated:Patient Re-evaluated prior to induction Oxygen Delivery Method: Circle System Utilized Preoxygenation: Pre-oxygenation with 100% oxygen Induction Type: IV induction Ventilation: Mask ventilation without difficulty Laryngoscope Size: Glidescope and 4 Grade View: Grade I Tube type: Oral Tube size: 7.0 mm Number of attempts: 1 Airway Equipment and Method: Stylet and Oral airway Placement Confirmation: ETT inserted through vocal cords under direct vision, positive ETCO2 and breath sounds checked- equal and bilateral Secured at: 22 cm Tube secured with: Tape Dental Injury: Teeth and Oropharynx as per pre-operative assessment

## 2024-01-23 NOTE — Discharge Summary (Signed)
 Orthopedic Surgery Discharge Summary  Patient name: Taylor Gordon Patient MRN: [id number] Admit today: 01/23/2024 Discharge date: 01/24/2024  Attending physician: Colette Davies, MD Final diagnosis: cervical radiculopathy Findings: C4/5, C5/6, C6/7 degenerative discs with anterior osteophyte formation   Hospital course: Patient is a 53 y.o. adult who was admitted after undergoing C4-7 anterior cervical discectomy and fusion. The patient had significant pain immediately after surgery, but pain eventually was controlled with a multimodal regimen including oxycodone. Labs during the hospitalization revealed no significant electrolyte abnormalities. The patient worked with physical therapy who recommended discharge to home. The patient was tolerating an oral diet without issue and was voiding spontaneously after surgery. The patient's vitals were stable on the day of discharge. The patient's drain was removed on the day of discharge. The patient was medically ready for discharge and was discharge to home on post-operative day one.  Instructions:   Orthopedic Surgery Discharge Instructions  Patient name: Taylor Gordon Procedure Performed: C4-7 anterior cervical discectomy and fusion Date of Surgery: 01/23/2024 Surgeon: Colette Davies, MD  Pre-operative Diagnosis: cervical radiculopathy Post-operative Diagnosis: same as above  Discharged to: home Discharge Condition: stable  Activity: You should wear your cervical collar for six weeks after surgery. It is okay to remove the cervical collar when you are taking a shower, but you should have it on at all other times. You should refrain from bending, lifting, or twisting with objects greater than ten pounds until three months after surgery. You are encouraged to walk as much as desired. You can perform household activities such as cleaning dishes, doing laundry, vacuuming, etc. as long as the ten-pound restriction is followed.  Incision Care:  Your incision site has a dressing over it. That dressing should remain in place and dry at all times for a total of one week after surgery. After one week, you can remove the dressing. Underneath the dressing, you will find pieces of tape. You should leave these pieces of tape in place. They will fall off with time. Do not pick, rub, or scrub at them. Do not put cream or lotion over the surgical area. After one week and once the dressing is off, it is okay to let soap and water run over your incision. Again, do not pick, scrub, or rub at the pieces of tape when bathing. Do not submerge (e.g., take a bath, swim, go in a hot tub, etc.) until six weeks after surgery. There may be some bloody drainage from the incision into the dressing after surgery. This is normal. You do not need to replace the dressing. Continue to leave it in place for the one week as instructed above. Should the dressing become saturated with blood or drainage, please call the office for further instructions.   Medications: You have been prescribed oxycodone. This is a narcotic pain medication and should only be taken as prescribed. You should not drink alcohol or operate heavy machinery (including driving) while taking this medication. The oxycodone can cause constipation as a side effect. For that reason, you have been prescribed senna and miralax. These are both laxatives. You do not need to take this medication if you develop diarrhea. Should you remain constipated even while taking these medications, please increase the dose of miralax to twice daily. Tylenol  has been prescribed to be taken every 8 hours, which will give you additional pain relief. Robaxin is a muscle relaxer that has been prescribed to you for muscle spasm type pain. Take this medication as needed.  Do not take NSAIDs (ibuprofen, Aleve, Celebrex, naproxen, meloxicam, etc.) for the first 6 weeks after surgery as there is some evidence that their use may decrease the  chances of successful fusion.   In order to set expectations for opioid prescriptions, you will only be prescribed opioids for a total of six weeks after surgery and, at two-weeks after surgery, your opioid prescription will start to tapered (decreased dosage and number of pills). If you have ongoing need for opioid medication six weeks after surgery, you will be referred to pain management. If you are already established with a provider that is giving you opioid medications, you should schedule an appointment with them for six weeks after surgery if you feel you are going to need another prescription. State law only allows for opioid prescriptions one week at a time. If you are running out of opioid medication near the end of the week, please call the office during business hours before running out so I can send you another prescription.   You may resume any home blood thinners (aspirin, warfarin, lovenox, apixaban, plavix, xarelto, etc.) 72 hours after your surgery. Take these medications as they were previously prescribed.   Driving: You should not drive while taking narcotic pain medications. You should also not drive if you feel you cannot turn your body enough to check your blind spots while in your cervical collar. In this case, you should wait six weeks to drive when your cervical collar will be discontinued. You should start getting back to driving slowly and you may want to try driving in a parking lot before doing anything more. Please be mindful that in some states it is illegal to drive with a cervical collar in place. You should check your local laws before driving and you may have to wait until the collar is discontinued before driving.  Diet: You are safe to resume your regular diet. A common complication after cervical spine surgery is trouble swallowing especially if the incision was made over the front part of your neck. This complication happens often and, the vast majority of the time,  it is a temporary problem that gets better with time. The first few days after surgery, you should take more bites than normal to break the food into smaller pieces before swallowing. With time as the swallowing becomes easier, you can gradually get back to taking bites like you normally would.   Reasons to Call the Office After Surgery: You should feel free to call the office with any concerns or questions you have in the post-operative period, but you should definitely notify the office if you develop: -shortness of breath, chest pain, or trouble breathing -excessive bleeding, drainage, redness, or swelling around the surgical site -fevers, chills, or pain that is getting worse with each passing day -persistent nausea or vomiting -new weakness in any extremity, new or worsening numbness or tingling in any extremity -numbness in the groin, bowel or bladder incontinence -other concerns about your surgery  Follow Up Appointments: You should have an office appointment scheduled for approximately two weeks after surgery. If you do not remember when this appointment is or do not already have it scheduled, please call the office to schedule.   Office Information:  -Colette Davies, MD -Phone number: [phone] -Address: [address]       [place], [place] [address]

## 2024-01-23 NOTE — Anesthesia Postprocedure Evaluation (Signed)
 Anesthesia Post Note  Patient: OCTAVION MOLLENKOPF  Procedure(s) Performed: ANTERIOR CERVICAL DECOMPRESSION/DISCECTOMY FUSION 3 LEVELS (Spine Cervical)     Patient location during evaluation: PACU Anesthesia Type: General Level of consciousness: awake and alert Pain management: pain level controlled Vital Signs Assessment: post-procedure vital signs reviewed and stable Respiratory status: spontaneous breathing, nonlabored ventilation, respiratory function stable and patient connected to nasal cannula oxygen Cardiovascular status: blood pressure returned to baseline and stable Postop Assessment: no apparent nausea or vomiting Anesthetic complications: no   There were no known notable events for this encounter.  Last Vitals:  Vitals:   01/23/24 1500 01/23/24 1515  BP: (!) 128/96 (!) 141/89  Pulse: (!) 111 100  Resp: 15 12  Temp:    SpO2: 98% 100%    Last Pain:  Vitals:   01/23/24 1445  TempSrc:   PainSc: 6                  Leslye Rast

## 2024-01-23 NOTE — Progress Notes (Signed)
 Orthopedic Surgery Post-operative Progress Note  Assessment: Patient is a 53 y.o. adult who is currently admitted after undergoing C4-7 ACDF   Plan: -Operative plans complete -Needs upright films when able -Drain will be pulled tomorrow morning -Out of bed as tolerated with aspen collar -No bending/lifting/twisting greater than 10 pounds -OT evaluation and treat -Pain control -Regular diet -Ancef x2 post-operative doses -No antiplatelet or dvt chemoprophylaxis for 72 hours after surgery -Disposition: to floor from PACU  _________________________________________________________________________  Subjective: No acute events since surgery. Recovering in PACU. Pain well controlled.   Objective:  General: no acute distress, appropriate affect Neurologic: alert, answering questions appropriately, following commands Respiratory: unlabored breathing on room air Neck: no hematoma appreciated Skin: dressing clear/dry/intact  MSK (spine):  -Strength exam      Right  Left Grip strength                5/5  5/5 Interosseus   5/5   5/5 Wrist extension  5/5  4/5 Wrist flexion   5/5  4/5 Elbow flexion   5/5  5/5 Deltoid    5/5  5/5  EHL    5/5  5/5 TA    5/5  5/5 GSC    5/5  5/5 Knee extension  5/5  5/5 Hip flexion   5/5  5/5  -Sensory exam    Sensation intact to light touch in L2-S1 nerve distributions of bilateral lower extremities  Sensation intact to light touch in C4-T1 nerve distributions of bilateral upper extremities   Patient name: Taylor Gordon Patient MRN: [id number] Date: 01/23/24

## 2024-01-23 NOTE — Anesthesia Preprocedure Evaluation (Signed)
 Anesthesia Evaluation  Patient identified by MRN, date of birth, ID band Patient awake    Reviewed: Allergy & Precautions, H&P , NPO status , Patient's Chart, lab work & pertinent test results  Airway Mallampati: II  TM Distance: >3 FB Neck ROM: Full    Dental no notable dental hx.    Pulmonary asthma , sleep apnea , former smoker   Pulmonary exam normal breath sounds clear to auscultation       Cardiovascular hypertension, Pt. on medications negative cardio ROS Normal cardiovascular exam Rhythm:Regular Rate:Normal     Neuro/Psych  Headaches  negative psych ROS   GI/Hepatic negative GI ROS, Neg liver ROS,,,  Endo/Other  negative endocrine ROS    Renal/GU negative Renal ROS  negative genitourinary   Musculoskeletal negative musculoskeletal ROS (+)    Abdominal   Peds negative pediatric ROS (+)  Hematology negative hematology ROS (+)   Anesthesia Other Findings   Reproductive/Obstetrics negative OB ROS                             Anesthesia Physical Anesthesia Plan  ASA: 2  Anesthesia Plan: General   Post-op Pain Management:    Induction: Intravenous  PONV Risk Score and Plan: 2 and Ondansetron , Midazolam and Treatment may vary due to age or medical condition  Airway Management Planned: Oral ETT  Additional Equipment:   Intra-op Plan:   Post-operative Plan: Extubation in OR  Informed Consent: I have reviewed the patients History and Physical, chart, labs and discussed the procedure including the risks, benefits and alternatives for the proposed anesthesia with the patient or authorized representative who has indicated his/her understanding and acceptance.     Dental advisory given  Plan Discussed with: CRNA  Anesthesia Plan Comments:        Anesthesia Quick Evaluation

## 2024-01-24 ENCOUNTER — Other Ambulatory Visit (HOSPITAL_COMMUNITY): Payer: Self-pay

## 2024-01-24 DIAGNOSIS — M4802 Spinal stenosis, cervical region: Secondary | ICD-10-CM | POA: Diagnosis not present

## 2024-01-24 LAB — BASIC METABOLIC PANEL WITH GFR
Anion gap: 12 (ref 5–15)
BUN: 6 mg/dL (ref 6–20)
CO2: 20 mmol/L — ABNORMAL LOW (ref 22–32)
Calcium: 9 mg/dL (ref 8.9–10.3)
Chloride: 103 mmol/L (ref 98–111)
Creatinine, Ser: 1.07 mg/dL (ref 0.61–1.24)
GFR, Estimated: >60 mL/min (ref >60)
Glucose, Bld: 106 mg/dL — ABNORMAL HIGH (ref 70–99)
Potassium: 3.8 mmol/L (ref 3.5–5.1)
Sodium: 135 mmol/L (ref 135–145)

## 2024-01-24 NOTE — Progress Notes (Signed)
 Patient being discharged, packet reviewed at bedside, iv access removed, patient being discharged via private transportation to home. Toc meds delivered at bedside.

## 2024-01-24 NOTE — Progress Notes (Signed)
 Orthopedic Surgery Post-operative Progress Note  Assessment: Patient is a 53 y.o. adult who is currently admitted after undergoing C4-7 ACDF   Plan: -Operative plans complete -Drain removed this morning -Out of bed as tolerated with aspen collar -No bending/lifting/twisting greater than 10 pounds -OT evaluation and treat -Pain control -Regular diet -Ancef  x2 post-operative doses -No antiplatelet or dvt chemoprophylaxis for 72 hours after surgery -Anticipate discharge to home today  _________________________________________________________________________  Subjective: No acute events overnight. Pain well controlled with oral medication. Had trouble swallowing harder foods last night but tolerated mashed potatoes and green beans. No issues with liquids. Radiating left arm pain has improved since surgery but is still present. Denies paresthesias and numbness.   Objective:  General: no acute distress, appropriate affect, up in the chair Neurologic: alert, answering questions appropriately, following commands Respiratory: unlabored breathing on room air Neck: no hematoma appreciated, c collar in place Skin: dressing clear/dry/intact  MSK (spine):  -Strength exam      Right  Left Grip strength                5/5  5/5 Interosseus   5/5   5/5 Wrist extension  5/5  4/5 Wrist flexion   5/5  4/5 Elbow flexion   5/5  5/5 Deltoid    5/5  5/5  EHL    5/5  5/5 TA    5/5  5/5 GSC    5/5  5/5 Knee extension  5/5  5/5 Hip flexion   5/5  5/5  -Sensory exam    Sensation intact to light touch in L2-S1 nerve distributions of bilateral lower extremities  Sensation intact to light touch in C4-T1 nerve distributions of bilateral upper extremities   Patient name: Taylor Gordon Patient MRN: [id number] Date: 01/24/24

## 2024-01-24 NOTE — Evaluation (Signed)
 Occupational Therapy Evaluation Patient Details Name: Taylor Gordon MRN: [id number] DOB: 1970/08/31 Today's Date: 01/24/2024   History of Present Illness   53 yo s/p C4-7 ACDF. PMH: Chronic pain, HTN, migrains     Clinical Impressions PTA pt lives alone independently. Completed all education regarding cervical precautions with ADL tasks, IADL and mobility. Pt independently ambulated @ 400 ft and negotiated 10 steps. Pt dressed during session to return demonstrate ability to complete ADL tasks and manage donning/doffing cervical collar. Pt issued foam block, squeeze ball and level 3 theraputty to work on grip and pinch strength and fine motor/coordination tasks. No further OT needed. Pt very appreciative.      If plan is discharge home, recommend the following:   Assistance with cooking/housework;Assist for transportation     Functional Status Assessment   Patient has not had a recent decline in their functional status     Equipment Recommendations   Other (comment) (pt to get shower chair on his own)     Recommendations for Other Services         Precautions/Restrictions   Precautions Precautions: Cervical Precaution Booklet Issued: Yes (comment) Required Braces or Orthoses: Cervical Brace Cervical Brace: Hard collar;At all times;Other (comment) (off for shower) Restrictions Weight Bearing Restrictions Per Provider Order: No     Mobility Bed Mobility               General bed mobility comments: reviewed techniques for log rolling; Pt OOB in chair    Transfers Overall transfer level: Modified independent                        Balance Overall balance assessment: Mild deficits observed, not formally tested (1 fall over 6 months ago)                                         ADL either performed or assessed with clinical judgement   ADL Overall ADL's : Needs assistance/impaired                                        General ADL Comments: Educated on compensatory strategies during ADL tasks and use of AE and DME if needed to adhere to precautions and reduce risk of falls; educated on home safety     Vision Baseline Vision/History: 1 Wears glasses       Perception         Praxis         Pertinent Vitals/Pain Pain Assessment Pain Assessment: 0-10 Pain Score: 6  Pain Location: neck Pain Descriptors / Indicators: Aching, Stabbing, Throbbing Pain Intervention(s): Limited activity within patient's tolerance     Extremity/Trunk Assessment Upper Extremity Assessment Upper Extremity Assessment: Right hand dominant;RUE deficits/detail;LUE deficits/detail RUE Deficits / Details: overall WFL; pt states numbness is  better LUE Deficits / Details: numbness tingling pain; decreased grip and pinch strength; clumsy with fine motor tasks; improved since surgery LUE Coordination: decreased fine motor   Lower Extremity Assessment Lower Extremity Assessment: LLE deficits/detail LLE Deficits / Details: weakness L leg (no buckling noted during ambulation)   Cervical / Trunk Assessment Cervical / Trunk Assessment: Neck Surgery   Communication Communication Communication: No apparent difficulties   Cognition Arousal: Alert Behavior During Therapy: WFL for tasks assessed/performed Cognition:  No apparent impairments                                       Cueing  General Comments          Exercises Exercises: Other exercises Other Exercises Other Exercises: blue foam block - grip and pinch strengthening Other Exercises: level 3 theraputty - grip and pinch strengthening and incorporating in-hand manipulation tasks (coins in putty, hold in hand and place on table one at a time; working with nuts/bolts)   Shoulder Instructions      Home Living Family/patient expects to be discharged to:: Private residence Living Arrangements: Other relatives Available Help at  Discharge: Family Type of Home: House Home Access: Stairs to enter   Entrance Stairs-Rails: Right;Left;Can reach both Home Layout: Two level;Able to live on main level with bedroom/bathroom     Bathroom Shower/Tub: Tub/shower unit;Curtain   Bathroom Toilet: Standard Bathroom Accessibility: Yes How Accessible: Accessible via walker Home Equipment: Cane - quad          Prior Functioning/Environment Prior Level of Function : Independent/Modified Independent;Driving                    OT Problem List: Decreased strength;Decreased coordination;Impaired balance (sitting and/or standing);Pain;Impaired UE functional use   OT Treatment/Interventions:        OT Goals(Current goals can be found in the care plan section)   Acute Rehab OT Goals Patient Stated Goal: rturn to kayaking OT Goal Formulation: All assessment and education complete, DC therapy   OT Frequency:       Co-evaluation              AM-PAC OT "6 Clicks" Daily Activity     Outcome Measure Help from another person eating meals?: None Help from another person taking care of personal grooming?: None Help from another person toileting, which includes using toliet, bedpan, or urinal?: None Help from another person bathing (including washing, rinsing, drying)?: None Help from another person to put on and taking off regular upper body clothing?: None Help from another person to put on and taking off regular lower body clothing?: None 6 Click Score: 24   End of Session Equipment Utilized During Treatment: Gait belt;Cervical collar Nurse Communication: Mobility status;Other (comment) (ready to DC)  Activity Tolerance: Patient tolerated treatment well Patient left: in chair;with call bell/phone within reach  OT Visit Diagnosis: Unsteadiness on feet (R26.81);Muscle weakness (generalized) (M62.81);Pain Pain - part of body:  (neck; shoulders)                Time: 1610-9604 OT Time Calculation (min): 30  min Charges:  OT General Charges $OT Visit: 1 Visit OT Evaluation $OT Eval Low Complexity: 1 Low OT Treatments $Self Care/Home Management : 8-22 mins  Milburn Aliment, OT/L   Acute OT Clinical Specialist Acute Rehabilitation Services Pager [phone] Office [phone]   [facility] 01/24/2024, 9:17 AM

## 2024-01-24 NOTE — Plan of Care (Signed)
  Problem: Education: Goal: Knowledge of General Education information will improve Description: Including pain rating scale, medication(s)/side effects and non-pharmacologic comfort measures Outcome: Progressing   Problem: Health Behavior/Discharge Planning: Goal: Ability to manage health-related needs will improve Outcome: Progressing   Problem: Clinical Measurements: Goal: Ability to maintain clinical measurements within normal limits will improve Outcome: Progressing Goal: Will remain free from infection Outcome: Progressing Goal: Respiratory complications will improve Outcome: Progressing Goal: Cardiovascular complication will be avoided Outcome: Progressing   Problem: Activity: Goal: Risk for activity intolerance will decrease Outcome: Progressing   Problem: Nutrition: Goal: Adequate nutrition will be maintained Outcome: Progressing   Problem: Coping: Goal: Level of anxiety will decrease Outcome: Progressing   Problem: Elimination: Goal: Will not experience complications related to bowel motility Outcome: Progressing Goal: Will not experience complications related to urinary retention Outcome: Progressing   Problem: Pain Managment: Goal: General experience of comfort will improve and/or be controlled Outcome: Progressing   Problem: Safety: Goal: Ability to remain free from injury will improve Outcome: Progressing   Problem: Skin Integrity: Goal: Risk for impaired skin integrity will decrease Outcome: Progressing

## 2024-01-27 ENCOUNTER — Encounter (HOSPITAL_COMMUNITY): Payer: Self-pay | Admitting: Orthopedic Surgery

## 2024-02-05 ENCOUNTER — Other Ambulatory Visit (INDEPENDENT_AMBULATORY_CARE_PROVIDER_SITE_OTHER): Payer: Self-pay

## 2024-02-05 ENCOUNTER — Ambulatory Visit (INDEPENDENT_AMBULATORY_CARE_PROVIDER_SITE_OTHER): Admitting: Orthopedic Surgery

## 2024-02-05 DIAGNOSIS — Z981 Arthrodesis status: Secondary | ICD-10-CM | POA: Diagnosis not present

## 2024-02-05 NOTE — Progress Notes (Signed)
 Orthopedic Surgery Post-operative Office Visit  Procedure: C4-7 ACDF Date of Surgery: 01/23/2024 (~2 weeks post-op)  Assessment: Patient is a 54 y.o. male whose radiating left arm pain has improving since surgery. Still having some neck pain and difficulty sleeping in the collar   Plan: -Operative plans complete -Out of bed as tolerated, cervical collar -No bending/lifting/twisting greater than 10 pounds -Will discontinue the cervical collar at our next visit -Okay to let soap/water run over incision, but do not submerge -Pain management: tylenol , robaxin  -Return to office in 4 weeks, films needed at next visit: AP/lateral cervical  ___________________________________________________________________________   Subjective: Patient has noticed improvement in his radiating arm pain since the surgery.  He says he has some paresthesias along the dorsal forearm and into the radial hand but these are not particularly bothersome.  He has no other numbness or paresthesias.  He has not noticed any redness or drainage around the incision.  He does have neck pain and has had discomfort in the collar.  He has trouble sleeping at night as a result of the collar.  He said he has claustrophobia and that collar gives him discomfort.  Objective:  General: no acute distress, appropriate affect Neurologic: alert, answering questions appropriately, following commands Respiratory: unlabored breathing on room air Skin: incision is well approximated with no erythema, induration, active/expressible drainage  MSK (spine):  -Strength exam      Left  Right Grip strength                5/5  5/5 Interosseus   5/5   5/5 Wrist extension  5/5  5/5 Wrist flexion   5/5  5/5 Elbow flexion   5/5  5/5 Deltoid    5/5  5/5  EHL    5/5  5/5 TA    5/5  5/5 GSC    5/5  5/5 Knee extension  5/5  5/5 Hip flexion   5/5  5/5  -Sensory exam    Sensation intact to light touch in L2-S1 nerve distributions of bilateral  lower extremities  Sensation intact to light touch in C4-T1 nerve distributions of bilateral upper extremities   Imaging: X-rays of the cervical spine taken 02/05/2024 were independently reviewed and interpreted, showing allograft interbody devices in the former C4/5, C5/6, C6/7 disc spaces.  Interbody's appear in appropriate position.  There is anterior cervical instrumentation from C4-C7.  No lucency seen around the screws.  The screws in C7 approach the C7/T1 disc space but do not appear to violate the C7 endplate. No fracture or dislocation seen.    Patient name: Taylor Gordon Patient MRN: [id number] Date of visit: 02/05/24

## 2024-02-06 ENCOUNTER — Encounter: Payer: Self-pay | Admitting: Orthopedic Surgery

## 2024-02-06 MED ORDER — METHOCARBAMOL 500 MG PO TABS: 500.0000 mg | ORAL_TABLET | Freq: Four times a day (QID) | ORAL | 0 refills | Status: AC | PRN

## 2024-02-16 ENCOUNTER — Encounter: Payer: Self-pay | Admitting: Orthopedic Surgery

## 2024-02-16 ENCOUNTER — Telehealth: Payer: Self-pay | Admitting: Orthopedic Surgery

## 2024-02-16 NOTE — Telephone Encounter (Signed)
 Patient called and wanted to know if its normal to have pain in the right hand from the surgery he had. CB#[phone]

## 2024-03-04 ENCOUNTER — Other Ambulatory Visit (INDEPENDENT_AMBULATORY_CARE_PROVIDER_SITE_OTHER): Payer: Self-pay

## 2024-03-04 ENCOUNTER — Ambulatory Visit: Admitting: Orthopedic Surgery

## 2024-03-04 DIAGNOSIS — Z981 Arthrodesis status: Secondary | ICD-10-CM

## 2024-03-04 NOTE — Progress Notes (Signed)
 Orthopedic Surgery Post-operative Office Visit   Procedure: C4-7 ACDF Date of Surgery: 01/23/2024 (~6 weeks post-op)   Assessment: Patient is a 53 y.o. male who notes that his radiating arm pain has improved somewhat with surgery but he still feels it.  He is also had continued discomfort with the cervical collar     Plan: -Operative plans complete -Out of bed as tolerated, no bending/lifting/twisting greater than 10 pounds -Discontinue cervical collar use -Okay to submerge wound at this point -Pain management: tylenol  as needed -Explained that given his long duration (he told me 12 years today in the office but my original note said about 2) of symptoms, my expectation is that he will not go to pain-free but a more realistic goal would be to have improvement in his pain. Since has noticed improvement, we will continue to monitor for improvement and his final outcome would be at about 1 year from surgery -Return to office in 6 weeks, films needed at next visit: AP/lateral/flex/ex cervical   ___________________________________________________________________________     Subjective: Patient has noticed some improvement in his neck and radiating arm pain.  He had a lot of discomfort with the cervical collar and wear it sits on his shoulders.  He has not noticed any new symptoms since he was last seen.  He is hoping that he will notice continued improvement going forward and his arm pain.  Has not noticed any redness or drainage around his incision.   Objective:   General: no acute distress, appropriate affect Neurologic: alert, answering questions appropriately, following commands Respiratory: unlabored breathing on room air Skin: incision is well healed with no erythema, induration, active/expressible drainage   MSK (spine):   -Strength exam                                                   Left                  Right Grip strength                5/5                  5/5 Interosseus                   5/5                  5/5 Wrist extension            5/5                  5/5 Wrist flexion                 5/5                  5/5 Elbow flexion                5/5                  5/5 Deltoid                          5/5                  5/5   EHL  5/5                  5/5 TA                                 5/5                  5/5 GSC                             5/5                  5/5 Knee extension            5/5                  5/5 Hip flexion                    5/5                  5/5   -Sensory exam                           Sensation intact to light touch in L2-S1 nerve distributions of bilateral lower extremities             Sensation intact to light touch in C4-T1 nerve distributions of bilateral upper extremities     Imaging: X-rays of the cervical spine taken 03/04/2024 were independently reviewed and interpreted, showing anterior cementation from C4-7.  No lucency seen around the screws.  Interbody devices appear in appropriate position at C4/5, C5/6 and C6/7.  No fracture or dislocation seen.  No spondylolisthesis seen.     Patient name: Taylor Gordon Patient MRN: [id number] Date of visit: 03/04/24

## 2024-04-21 ENCOUNTER — Ambulatory Visit (INDEPENDENT_AMBULATORY_CARE_PROVIDER_SITE_OTHER): Admitting: Orthopedic Surgery

## 2024-04-21 ENCOUNTER — Other Ambulatory Visit (INDEPENDENT_AMBULATORY_CARE_PROVIDER_SITE_OTHER): Payer: Self-pay

## 2024-04-21 DIAGNOSIS — Z981 Arthrodesis status: Secondary | ICD-10-CM | POA: Diagnosis not present

## 2024-04-21 NOTE — Progress Notes (Signed)
 Orthopedic Surgery Post-operative Office Visit   Procedure: C4-7 ACDF Date of Surgery: 01/23/2024 (~3 months post-op)   Assessment: Patient is a 53 y.o. male who has noticed improvement in his symptoms after surgery but still has pain radiating into the left upper extremity     Plan: -No spine specific restrictions at this point -Pain management: tylenol  as needed -Will continue to monitor for further improvement -Return to office in 3 months, films needed at next visit: AP/lateral/flex/ex cervical   ___________________________________________________________________________     Subjective: Patient still notes improvement in his neck and radiating arm pain.  He has been paying more attention to it and feels that there has been significant proving.  He is now able to lay in his own bed at night and get sleep.  He also feels that the pain has improved but has not gone away.  It is still significant.  He feels the pain over the lateral aspect of the arm and dorsal forearm on the left side.  Not having any radiating arm pain to the right side.    Objective:   General: no acute distress, appropriate affect Neurologic: alert, answering questions appropriately, following commands Respiratory: unlabored breathing on room air Skin: incision is well healed   MSK (spine):   -Strength exam                                                   Left                  Right Grip strength                5/5                  5/5 Interosseus                  5/5                  5/5 Wrist extension            5/5                  5/5 Wrist flexion                 5/5                  5/5 Elbow flexion                5/5                  5/5 Deltoid                          5/5                  5/5   EHL                              5/5                  5/5 TA                                 5/5  5/5 GSC                             5/5                  5/5 Knee extension             5/5                  5/5 Hip flexion                    5/5                  5/5   -Sensory exam                           Sensation intact to light touch in L2-S1 nerve distributions of bilateral lower extremities             Sensation intact to light touch in C4-T1 nerve distributions of bilateral upper extremities     Imaging: XRs of the cervical spine from 04/21/2024 were independently reviewed and interpreted, showing allograft interbody devices at C4/5, C5/6, and C6/7.  No lucency seen around the interbody devices.  There is anterior instrumentation from C4-C7.  No lucency seen around any of the screws.  No evidence of instability on flexion/extension views.  No fracture or dislocation seen.     Patient name: Taylor Gordon Patient MRN: [id number] Date of visit: 04/21/24

## 2024-05-03 ENCOUNTER — Telehealth: Payer: Self-pay | Admitting: Orthopedic Surgery

## 2024-05-03 NOTE — Telephone Encounter (Signed)
 Social Services called and needs proof that he needs to stay out of work because of surgery. They have been calling him and no response [phone]

## 2024-05-05 ENCOUNTER — Encounter: Payer: Self-pay | Admitting: Radiology

## 2024-05-05 NOTE — Telephone Encounter (Signed)
 I called and lmom for Social Services to call me back with a fax number to send the letter to that I would get it to them ASAP.

## 2024-05-10 NOTE — Telephone Encounter (Signed)
 I faxed note to [phone]. I never got a call back from the number that was listed. This is the main fax number on [facility] [employer].

## 2024-05-12 ENCOUNTER — Encounter: Payer: Self-pay | Admitting: Orthopedic Surgery

## 2024-06-21 ENCOUNTER — Encounter: Payer: Self-pay | Admitting: Radiology

## 2024-07-21 ENCOUNTER — Encounter: Admitting: Orthopedic Surgery

## 2024-08-30 ENCOUNTER — Other Ambulatory Visit: Payer: Self-pay

## 2024-08-30 ENCOUNTER — Ambulatory Visit: Admitting: Orthopedic Surgery

## 2024-08-30 DIAGNOSIS — Z981 Arthrodesis status: Secondary | ICD-10-CM

## 2024-08-30 NOTE — Progress Notes (Addendum)
 Orthopedic Surgery Post-operative Office Visit   Procedure: C4-7 ACDF Date of Surgery: 01/23/2024 (~7 months post-op)   Assessment: Patient is a 54 y.o. male who has noticed significant improvement in his radiating left arm pain.  Still having significant neck pain     Plan: -No spine specific restrictions at this point -Can use tylenol , lidocaine  patches, ibuprofen -Patient is less than a year out from surgery.  His main complaint is neck pain.  I told him I think it is a little early to start evaluating for a pseudoarthrosis.  His symptoms may be coming from facet arthropathy as well.  If he is still having symptoms, we will get a CT scan at the year mark to evaluate both the facet joints and for pseudoarthrosis but I want to hold off on the CT scan so I can use it to both look at the facet joints and evaluate for pseudarthrosis with the same scan -Return to office in 5 months, films needed at next visit: AP/lateral/flex/ex cervical   ___________________________________________________________________________     Subjective: Patient is not having any significant arm pain at this point.  He is also notes improvement in the numbness and paresthesias in his fingertips.  He is still having neck pain though.  He said it has been severe lately.  He feels that on the left side of his neck and it goes into the left trapezius muscle.  He does not have pain radiating past that point.  He is not experience as much pain on the right side of his neck.  He has been using a soft collar at times to help with the pain.  There was no recent trauma or injury.     Objective:   General: no acute distress, appropriate affect Neurologic: alert, answering questions appropriately, following commands Respiratory: unlabored breathing on room air Skin: incision is well healed   MSK (spine):   -Strength exam                                                   Left                  Right Grip strength                 5/5                  5/5 Interosseus                  5/5                  5/5 Wrist extension            5/5                  5/5 Wrist flexion                 5/5                  5/5 Elbow flexion                5/5                  5/5 Deltoid  5/5                  5/5   EHL                              5/5                  5/5 TA                                 5/5                  5/5 GSC                             5/5                  5/5 Knee extension            5/5                  5/5 Hip flexion                    5/5                  5/5   -Sensory exam                           Sensation intact to light touch in L2-S1 nerve distributions of bilateral lower extremities             Sensation intact to light touch in C4-T1 nerve distributions of bilateral upper extremities     Imaging: XRs of the cervical spine from 08/30/2024 were independently reviewed and interpreted, showing allograft interbody devices at C4/5, C5/6, C6/7.  There is anterior instrumentation from C4-C7.  No lucency seen around the screws.  None screws are backed out.  No evidence of instability on flexion/extension views.  No fracture or dislocation seen.     Patient name: Taylor Gordon Patient MRN: [id number] Date of visit: 08/30/2024   Pre-operative Scores   NDI: 78 VAS neck: 10/10 VAS arm: 8/10  6 Month Post-operative Scores   NDI: 62 VAS neck: 8/10 VAS arm: 2/10

## 2024-09-13 ENCOUNTER — Ambulatory Visit: Admitting: Orthopedic Surgery

## 2025-01-31 ENCOUNTER — Ambulatory Visit: Admitting: Orthopedic Surgery
